# Patient Record
Sex: Female | Born: 1970 | Race: Black or African American | Hispanic: No | Marital: Married | Smoking: Former smoker
Health system: Southern US, Community
[De-identification: ages and names within clinical notes are randomized; demographics above are authoritative.]

## PROBLEM LIST (undated history)

## (undated) DIAGNOSIS — J309 Allergic rhinitis, unspecified: Secondary | ICD-10-CM

## (undated) DIAGNOSIS — T7840XA Allergy, unspecified, initial encounter: Secondary | ICD-10-CM

## (undated) DIAGNOSIS — E119 Type 2 diabetes mellitus without complications: Secondary | ICD-10-CM

## (undated) DIAGNOSIS — G629 Polyneuropathy, unspecified: Secondary | ICD-10-CM

## (undated) DIAGNOSIS — G709 Myoneural disorder, unspecified: Secondary | ICD-10-CM

## (undated) HISTORY — DX: Allergic rhinitis, unspecified: J30.9

## (undated) HISTORY — DX: Type 2 diabetes mellitus without complications: E11.9

## (undated) HISTORY — DX: Allergy, unspecified, initial encounter: T78.40XA

## (undated) HISTORY — PX: OTHER SURGICAL HISTORY: SHX169

## (undated) HISTORY — PX: TONSILLECTOMY AND ADENOIDECTOMY: SHX28

---

## 1993-11-17 HISTORY — PX: OTHER SURGICAL HISTORY: SHX169

## 1998-06-11 ENCOUNTER — Other Ambulatory Visit: Admission: RE | Admit: 1998-06-11 | Discharge: 1998-06-11 | Payer: Self-pay | Admitting: Family Medicine

## 1999-08-12 ENCOUNTER — Emergency Department (HOSPITAL_COMMUNITY): Admission: EM | Admit: 1999-08-12 | Discharge: 1999-08-12 | Payer: Self-pay | Admitting: Emergency Medicine

## 2000-02-20 ENCOUNTER — Other Ambulatory Visit: Admission: RE | Admit: 2000-02-20 | Discharge: 2000-02-20 | Payer: Self-pay | Admitting: Family Medicine

## 2000-06-13 ENCOUNTER — Emergency Department (HOSPITAL_COMMUNITY): Admission: EM | Admit: 2000-06-13 | Discharge: 2000-06-13 | Payer: Self-pay | Admitting: Emergency Medicine

## 2001-02-23 ENCOUNTER — Emergency Department (HOSPITAL_COMMUNITY): Admission: EM | Admit: 2001-02-23 | Discharge: 2001-02-23 | Payer: Self-pay | Admitting: Emergency Medicine

## 2001-12-10 ENCOUNTER — Ambulatory Visit (HOSPITAL_COMMUNITY): Admission: RE | Admit: 2001-12-10 | Discharge: 2001-12-10 | Payer: Self-pay | Admitting: Family Medicine

## 2001-12-17 ENCOUNTER — Encounter: Payer: Self-pay | Admitting: Family Medicine

## 2001-12-17 ENCOUNTER — Emergency Department (HOSPITAL_COMMUNITY): Admission: EM | Admit: 2001-12-17 | Discharge: 2001-12-17 | Payer: Self-pay | Admitting: *Deleted

## 2002-05-03 ENCOUNTER — Other Ambulatory Visit: Admission: RE | Admit: 2002-05-03 | Discharge: 2002-05-03 | Payer: Self-pay | Admitting: Family Medicine

## 2003-02-23 ENCOUNTER — Emergency Department (HOSPITAL_COMMUNITY): Admission: EM | Admit: 2003-02-23 | Discharge: 2003-02-23 | Payer: Self-pay | Admitting: Emergency Medicine

## 2003-02-23 ENCOUNTER — Encounter: Payer: Self-pay | Admitting: Emergency Medicine

## 2003-10-24 ENCOUNTER — Emergency Department (HOSPITAL_COMMUNITY): Admission: EM | Admit: 2003-10-24 | Discharge: 2003-10-24 | Payer: Self-pay | Admitting: Emergency Medicine

## 2003-11-13 ENCOUNTER — Emergency Department (HOSPITAL_COMMUNITY): Admission: EM | Admit: 2003-11-13 | Discharge: 2003-11-13 | Payer: Self-pay | Admitting: Emergency Medicine

## 2005-02-19 ENCOUNTER — Ambulatory Visit: Payer: Self-pay | Admitting: Internal Medicine

## 2005-03-17 ENCOUNTER — Ambulatory Visit: Payer: Self-pay | Admitting: Family Medicine

## 2005-03-19 ENCOUNTER — Ambulatory Visit: Payer: Self-pay | Admitting: Family Medicine

## 2005-04-08 ENCOUNTER — Other Ambulatory Visit: Admission: RE | Admit: 2005-04-08 | Discharge: 2005-04-08 | Payer: Self-pay | Admitting: Family Medicine

## 2005-04-08 ENCOUNTER — Ambulatory Visit: Payer: Self-pay | Admitting: Internal Medicine

## 2005-10-14 ENCOUNTER — Ambulatory Visit: Payer: Self-pay | Admitting: Family Medicine

## 2005-12-08 ENCOUNTER — Ambulatory Visit: Payer: Self-pay | Admitting: Family Medicine

## 2006-05-29 ENCOUNTER — Ambulatory Visit: Payer: Self-pay | Admitting: Family Medicine

## 2009-01-15 ENCOUNTER — Ambulatory Visit: Payer: Self-pay | Admitting: Internal Medicine

## 2010-08-06 ENCOUNTER — Emergency Department (HOSPITAL_COMMUNITY): Admission: EM | Admit: 2010-08-06 | Discharge: 2010-08-06 | Payer: Self-pay | Admitting: Emergency Medicine

## 2011-01-30 LAB — POCT URINALYSIS DIPSTICK
Bilirubin Urine: NEGATIVE
Glucose, UA: NEGATIVE mg/dL
Nitrite: NEGATIVE
Protein, ur: NEGATIVE mg/dL
Specific Gravity, Urine: 1.025 (ref 1.005–1.030)
Urobilinogen, UA: 0.2 mg/dL (ref 0.0–1.0)
pH: 5.5 (ref 5.0–8.0)

## 2011-01-30 LAB — POCT I-STAT, CHEM 8
Creatinine, Ser: 0.7 mg/dL (ref 0.4–1.2)
Hemoglobin: 15 g/dL (ref 12.0–15.0)
Potassium: 3.6 mEq/L (ref 3.5–5.1)
Sodium: 139 mEq/L (ref 135–145)

## 2011-01-30 LAB — POCT PREGNANCY, URINE: Preg Test, Ur: NEGATIVE

## 2012-06-16 ENCOUNTER — Encounter (HOSPITAL_COMMUNITY): Payer: Self-pay | Admitting: *Deleted

## 2012-06-16 ENCOUNTER — Emergency Department (HOSPITAL_COMMUNITY)
Admission: EM | Admit: 2012-06-16 | Discharge: 2012-06-16 | Disposition: A | Discharge status: Home / Self Care | Payer: Self-pay | Source: Home / Self Care

## 2012-06-16 DIAGNOSIS — B9789 Other viral agents as the cause of diseases classified elsewhere: Secondary | ICD-10-CM

## 2012-06-16 DIAGNOSIS — N390 Urinary tract infection, site not specified: Secondary | ICD-10-CM

## 2012-06-16 DIAGNOSIS — R197 Diarrhea, unspecified: Secondary | ICD-10-CM

## 2012-06-16 DIAGNOSIS — B349 Viral infection, unspecified: Secondary | ICD-10-CM

## 2012-06-16 LAB — POCT URINALYSIS DIP (DEVICE)
Bilirubin Urine: NEGATIVE
Glucose, UA: NEGATIVE mg/dL
Ketones, ur: NEGATIVE mg/dL
Nitrite: NEGATIVE
pH: 6 (ref 5.0–8.0)

## 2012-06-16 MED ORDER — IBUPROFEN 800 MG PO TABS: 800.0000 mg | ORAL_TABLET | Freq: Three times a day (TID) | ORAL | Status: AC

## 2012-06-16 MED ORDER — ONDANSETRON 4 MG PO TBDP
ORAL_TABLET | ORAL | Status: AC
  Filled 2012-06-16: qty 1

## 2012-06-16 MED ORDER — ONDANSETRON 4 MG PO TBDP
8.0000 mg | ORAL_TABLET | Freq: Once | ORAL | Status: AC
  Administered 2012-06-16: 8 mg via ORAL

## 2012-06-16 MED ORDER — SULFAMETHOXAZOLE-TRIMETHOPRIM 800-160 MG PO TABS: 1.0000 | ORAL_TABLET | Freq: Two times a day (BID) | ORAL | Status: AC

## 2012-06-16 MED ORDER — ONDANSETRON HCL 4 MG PO TABS: 4.0000 mg | ORAL_TABLET | Freq: Four times a day (QID) | ORAL | Status: AC

## 2012-06-16 NOTE — ED Provider Notes (Signed)
History     CSN: [id number]  Arrival date & time 06/16/12  1606   None     Chief Complaint  Patient presents with  . Headache    (Consider location/radiation/quality/duration/timing/severity/associated sxs/prior treatment) The history is provided by the patient.  Madison Herrera is a 41 y.o. female who complains of bilateral temporal headache associated with dizziness, scalp tenderness and glands swollen noted today.  Reports onset of diarrhea that began 3 days ago.  Stool sizes vary from 2 tablespoons to 3 cupfuls with each episode x 7 episodes.  Stools brown in color, +fecal material associated with no fever.  + abdominal cramps, +bloating, +flatulence, -tenesmus, +anorexia, +nausea, -vomiting, +myalgias; denies bloody stools, mucus, foul odor.  Denies change in eating habits, no knowledge of spoiled food intake, no history of travel.  No ill contacts in home, no history of inflammatory bowel disease; denies family hx of celiac disease.  No history of headaches, this is not worse headache she has ever had, +photophobia, no extremity weakness, denies paresthesias.     Past Medical History  Diagnosis Date  . Diabetes mellitus     Past Surgical History  Procedure Date  . Cesarean section   . Btl     No family history on file.  History  Substance Use Topics  . Smoking status: Never Smoker   . Smokeless tobacco: Not on file  . Alcohol Use: Yes    OB History    Grav Para Term Preterm Abortions TAB SAB Ect Mult Living                  Review of Systems  Constitutional: Positive for fever, chills and fatigue.  HENT: Negative for sore throat and sinus pressure.   Eyes: Positive for photophobia.  Cardiovascular: Negative.   Gastrointestinal: Positive for nausea, abdominal pain and diarrhea. Negative for vomiting and blood in stool.  Genitourinary: Positive for flank pain.  Musculoskeletal: Positive for myalgias.  Skin: Negative.   Neurological: Positive for dizziness,  light-headedness and headaches.  Hematological: Positive for adenopathy.  All other systems reviewed and are negative.    Allergies  Penicillins  Home Medications  No current outpatient prescriptions on file.  BP 126/64  Pulse 74  Temp 98.5 F (36.9 C) (Oral)  Resp 16  SpO2 98%  LMP 05/22/2012  Physical Exam  Nursing note and vitals reviewed. Constitutional: She is oriented to person, place, and time. Vital signs are normal. She appears well-developed and well-nourished. She is active and cooperative. She has a sickly appearance.  HENT:  Head: Normocephalic.  Right Ear: Hearing, tympanic membrane, external ear and ear canal normal.  Left Ear: Hearing, tympanic membrane, external ear and ear canal normal.  Nose: Nose normal. Right sinus exhibits no maxillary sinus tenderness and no frontal sinus tenderness. Left sinus exhibits no maxillary sinus tenderness and no frontal sinus tenderness.       Scalp tenderness, head   Eyes: Conjunctivae and EOM are normal. Pupils are equal, round, and reactive to light. No scleral icterus.  Neck: Trachea normal. Neck supple.  Cardiovascular: Normal rate, regular rhythm, normal heart sounds and normal pulses.   Pulmonary/Chest: Effort normal and breath sounds normal.  Abdominal: Soft. Normal appearance and bowel sounds are normal. There is tenderness in the suprapubic area. There is no rebound and no CVA tenderness.  Lymphadenopathy:       Head (left side): Occipital adenopathy present.    She has cervical adenopathy.       Left  cervical: Superficial cervical adenopathy present.       Left: Inguinal adenopathy present.       Shotty and tender lymph nodes throughout  Neurological: She is alert and oriented to person, place, and time. She has normal strength. No cranial nerve deficit or sensory deficit. GCS eye subscore is 4. GCS verbal subscore is 5. GCS motor subscore is 6.  Skin: Skin is warm, dry and intact. No rash noted.  Psychiatric: She  has a normal mood and affect. Her speech is normal and behavior is normal. Judgment and thought content normal. Cognition and memory are normal.    ED Course  Procedures (including critical care time)  Labs Reviewed  POCT URINALYSIS DIP (DEVICE) - Abnormal; Notable for the following:    Hgb urine dipstick SMALL (*)     Urobilinogen, UA 2.0 (*)     Leukocytes, UA SMALL (*)  Biochemical Testing Only. Please order routine urinalysis from main lab if confirmatory testing is needed.   All other components within normal limits   No results found.   1. Diarrhea   2. Viral illness   3. UTI (lower urinary tract infection)       MDM  Increase fluids, rest.  Medications as prescribed, I think headache is related lack of dietary intake(fluids) and diarrhea.  Patient instructed to RTC or follow up with primary care provider if symptoms are not improved        Johnsie Kindred, NP 06/16/12 1816

## 2012-06-16 NOTE — ED Notes (Signed)
Pt  Has  Symptoms  Of  Headache   Nausea   Body  Aches           As  Well as   Some  Diarrhea        X  3  Days  -   She  denys  Any  Vomiting    -  No other  Family  Members  Ill     She  Ambulated  To  Exam room  With a  Fluid  Steady  Gait

## 2012-06-22 NOTE — ED Provider Notes (Signed)
Medical screening examination/treatment/procedure(s) were performed by non-physician practitioner and as supervising physician I was immediately available for consultation/collaboration.  Luiz Blare MD   Luiz Blare, MD 06/22/12 2239

## 2013-05-30 ENCOUNTER — Other Ambulatory Visit: Payer: Self-pay | Admitting: Internal Medicine

## 2013-05-30 DIAGNOSIS — N920 Excessive and frequent menstruation with regular cycle: Secondary | ICD-10-CM

## 2013-06-03 ENCOUNTER — Other Ambulatory Visit: Payer: BC Managed Care – PPO

## 2013-06-10 ENCOUNTER — Ambulatory Visit
Admission: RE | Admit: 2013-06-10 | Discharge: 2013-06-10 | Disposition: A | Discharge status: Home / Self Care | Payer: Medicaid Other | Source: Ambulatory Visit | Attending: Internal Medicine | Admitting: Internal Medicine

## 2013-06-10 DIAGNOSIS — N920 Excessive and frequent menstruation with regular cycle: Secondary | ICD-10-CM

## 2013-06-21 ENCOUNTER — Encounter: Payer: Self-pay | Admitting: Obstetrics

## 2013-06-21 ENCOUNTER — Other Ambulatory Visit: Payer: Self-pay | Admitting: Internal Medicine

## 2013-06-21 DIAGNOSIS — Z1231 Encounter for screening mammogram for malignant neoplasm of breast: Secondary | ICD-10-CM

## 2013-07-01 ENCOUNTER — Encounter: Payer: Self-pay | Admitting: Obstetrics

## 2013-07-01 ENCOUNTER — Ambulatory Visit (INDEPENDENT_AMBULATORY_CARE_PROVIDER_SITE_OTHER): Payer: Medicaid Other | Admitting: Obstetrics

## 2013-07-01 VITALS — BP 117/79 | HR 67 | Temp 99.8°F | Ht 62.0 in | Wt 172.0 lb

## 2013-07-01 DIAGNOSIS — D259 Leiomyoma of uterus, unspecified: Secondary | ICD-10-CM

## 2013-07-01 DIAGNOSIS — N920 Excessive and frequent menstruation with regular cycle: Secondary | ICD-10-CM

## 2013-07-01 DIAGNOSIS — Z1239 Encounter for other screening for malignant neoplasm of breast: Secondary | ICD-10-CM

## 2013-07-01 DIAGNOSIS — N946 Dysmenorrhea, unspecified: Secondary | ICD-10-CM

## 2013-07-01 HISTORY — DX: Excessive and frequent menstruation with regular cycle: N92.0

## 2013-07-01 HISTORY — DX: Dysmenorrhea, unspecified: N94.6

## 2013-07-01 HISTORY — DX: Leiomyoma of uterus, unspecified: D25.9

## 2013-07-01 MED ORDER — NAPROXEN 500 MG PO TBEC: 500.0000 mg | DELAYED_RELEASE_TABLET | Freq: Two times a day (BID) | ORAL | Status: DC

## 2013-07-01 MED ORDER — OXYCODONE-ACETAMINOPHEN 10-325 MG PO TABS: 1.0000 | ORAL_TABLET | Freq: Four times a day (QID) | ORAL | Status: DC | PRN

## 2013-07-01 MED ORDER — MEDROXYPROGESTERONE ACETATE 10 MG PO TABS: ORAL_TABLET | ORAL | Status: DC

## 2013-07-01 MED ORDER — OXYCODONE-ACETAMINOPHEN 10-325 MG PO TABS: 1.0000 | ORAL_TABLET | ORAL | Status: DC | PRN

## 2013-07-01 NOTE — Progress Notes (Signed)
Subjective:    Madison Herrera is a 42 y.o. female who presents with uterine fibroids. Periods are regular every 28-30 days, lasting 3- days days. Dysmenorrhea:severe, occurring premenstrually and throughout cycle. Cyclic symptoms include bloating, diarrhea, insomnia, irritability and pelvic pain. No intermenstrual bleeding, spotting, or discharge.  Current contraception: none History of abnormal Pap smear: no Family history of uterine or ovarian cancer: yes - Paternal Aunt Regular self breast exam: yes History of abnormal mammogram: never had one done Family history of breast cancer: no History of abnormal lipids: no Menstrual History: OB History   Grav Para Term Preterm Abortions TAB SAB Ect Mult Living   3 2 2  1     2       Menarche age: not asked  Patient's last menstrual period was 06/30/2013.    The following portions of the patient's history were reviewed and updated as appropriate: allergies, current medications, past family history, past medical history, past social history, past surgical history and problem list.  Review of Systems Pertinent items are noted in HPI.    Objective:     BP 117/79  Pulse 67  Temp(Src) 99.8 F (37.7 C) (Oral)  Ht 5\' 2"  (1.575 m)  Wt 172 lb (78.019 kg)  BMI 31.45 kg/m2  LMP 06/30/2013 No exam performed today, Consult only..    Assessment:    Symptomatic uterine fibroids.    Plan:    All questions answered. Agricultural engineer distributed. Considering Endometrial Ablation.  Provera Rx for 30 days prior to ablation Mammogram ordered F/U in 2 weeks for Annual and Pap.

## 2013-07-05 ENCOUNTER — Ambulatory Visit
Admission: RE | Admit: 2013-07-05 | Discharge: 2013-07-05 | Disposition: A | Discharge status: Home / Self Care | Payer: Medicaid Other | Source: Ambulatory Visit | Attending: Internal Medicine | Admitting: Internal Medicine

## 2013-07-05 DIAGNOSIS — Z1231 Encounter for screening mammogram for malignant neoplasm of breast: Secondary | ICD-10-CM

## 2013-07-11 ENCOUNTER — Other Ambulatory Visit: Payer: Self-pay

## 2013-07-11 ENCOUNTER — Other Ambulatory Visit: Payer: Self-pay | Admitting: Internal Medicine

## 2013-07-11 DIAGNOSIS — R928 Other abnormal and inconclusive findings on diagnostic imaging of breast: Secondary | ICD-10-CM

## 2013-07-13 ENCOUNTER — Encounter: Payer: Self-pay | Admitting: Obstetrics

## 2013-07-13 ENCOUNTER — Ambulatory Visit (INDEPENDENT_AMBULATORY_CARE_PROVIDER_SITE_OTHER): Payer: Medicaid Other | Admitting: Obstetrics

## 2013-07-13 VITALS — BP 102/68 | HR 81 | Temp 97.8°F | Ht 62.0 in | Wt 174.0 lb

## 2013-07-13 DIAGNOSIS — D259 Leiomyoma of uterus, unspecified: Secondary | ICD-10-CM

## 2013-07-13 DIAGNOSIS — N920 Excessive and frequent menstruation with regular cycle: Secondary | ICD-10-CM

## 2013-07-13 DIAGNOSIS — Z0000 Encounter for general adult medical examination without abnormal findings: Secondary | ICD-10-CM

## 2013-07-13 MED ORDER — MEDROXYPROGESTERONE ACETATE 10 MG PO TABS: ORAL_TABLET | ORAL | Status: DC

## 2013-07-13 NOTE — Progress Notes (Signed)
Subjective:     Madison Herrera is a 42 y.o. female here for a routine exam.  Current complaints: Patient is in office today to complete her exam ( bleeding at last visit). Patient wants to discuss her surgery plan.  Personal health questionnaire reviewed: no.   Gynecologic History Patient's last menstrual period was 06/30/2013. Contraception: abstinence Last Pap: 1 year +. Results were: normal Last mammogram: last week. Results were: abnormal- additional view of L breast  Obstetric History OB History  Gravida Para Term Preterm AB SAB TAB Ectopic Multiple Living  3 2 2  1     2     # Outcome Date GA Lbr Len/2nd Weight Sex Delivery Anes PTL Lv  3 TRM 1995 [redacted]w[redacted]d  10 lb 4 oz (4.649 kg) M CS   Y  2 ABT 1993          1 TRM 1992 [redacted]w[redacted]d  7 lb 12 oz (3.515 kg) F CS   Y       The following portions of the patient's history were reviewed and updated as appropriate: allergies, current medications, past family history, past medical history, past social history, past surgical history and problem list.  Review of Systems Pertinent items are noted in HPI.    Objective:    General appearance: alert and no distress Abdomen: normal findings: soft, non-tender Pelvic: cervix normal in appearance, external genitalia normal, no adnexal masses or tenderness, no cervical motion tenderness, uterus normal size, shape, and consistency and vagina normal without discharge    Assessment:    Healthy female exam.   H/O Fibroids and Menorrhagia.  Uterine cavity appeas normal on ultrasound.  Wants Endometrial Ablation.   Plan:    Education reviewed: Management of AUB.    Pap done.  Will schedule Ablation in 2 weeks.  Has been on Provera for past 2 weeks.

## 2013-07-14 LAB — PAP IG W/ RFLX HPV ASCU

## 2013-07-14 LAB — WET PREP BY MOLECULAR PROBE: Candida species: NEGATIVE

## 2013-07-15 ENCOUNTER — Other Ambulatory Visit: Payer: Self-pay | Admitting: *Deleted

## 2013-07-15 DIAGNOSIS — B9689 Other specified bacterial agents as the cause of diseases classified elsewhere: Secondary | ICD-10-CM

## 2013-07-15 MED ORDER — METRONIDAZOLE 500 MG PO TABS: 500.0000 mg | ORAL_TABLET | Freq: Two times a day (BID) | ORAL | Status: DC

## 2013-07-26 ENCOUNTER — Ambulatory Visit
Admission: RE | Admit: 2013-07-26 | Discharge: 2013-07-26 | Disposition: A | Discharge status: Home / Self Care | Payer: Medicaid Other | Source: Ambulatory Visit | Attending: Internal Medicine | Admitting: Internal Medicine

## 2013-07-26 DIAGNOSIS — R928 Other abnormal and inconclusive findings on diagnostic imaging of breast: Secondary | ICD-10-CM

## 2013-08-02 ENCOUNTER — Other Ambulatory Visit: Payer: Medicaid Other

## 2013-08-15 ENCOUNTER — Encounter: Payer: Self-pay | Admitting: Obstetrics

## 2013-08-16 ENCOUNTER — Encounter (HOSPITAL_COMMUNITY): Payer: Self-pay

## 2013-08-16 ENCOUNTER — Other Ambulatory Visit: Payer: Self-pay | Admitting: *Deleted

## 2013-08-16 ENCOUNTER — Encounter: Payer: Self-pay | Admitting: Obstetrics

## 2013-08-17 ENCOUNTER — Encounter (HOSPITAL_COMMUNITY): Payer: Self-pay | Admitting: Pharmacist

## 2013-08-19 ENCOUNTER — Ambulatory Visit (HOSPITAL_COMMUNITY): Payer: Medicaid Other | Admitting: Anesthesiology

## 2013-08-19 ENCOUNTER — Encounter (HOSPITAL_COMMUNITY): Payer: Self-pay | Admitting: Anesthesiology

## 2013-08-19 ENCOUNTER — Encounter (HOSPITAL_COMMUNITY)
Admission: RE | Disposition: A | Discharge status: Home / Self Care | Payer: Self-pay | Source: Ambulatory Visit | Attending: Obstetrics

## 2013-08-19 ENCOUNTER — Ambulatory Visit (HOSPITAL_COMMUNITY)
Admission: RE | Admit: 2013-08-19 | Discharge: 2013-08-19 | Disposition: A | Discharge status: Home / Self Care | Payer: Medicaid Other | Source: Ambulatory Visit | Attending: Obstetrics | Admitting: Obstetrics

## 2013-08-19 ENCOUNTER — Encounter (HOSPITAL_COMMUNITY): Payer: Self-pay | Admitting: *Deleted

## 2013-08-19 DIAGNOSIS — N938 Other specified abnormal uterine and vaginal bleeding: Secondary | ICD-10-CM | POA: Insufficient documentation

## 2013-08-19 DIAGNOSIS — N920 Excessive and frequent menstruation with regular cycle: Secondary | ICD-10-CM

## 2013-08-19 DIAGNOSIS — N840 Polyp of corpus uteri: Secondary | ICD-10-CM | POA: Insufficient documentation

## 2013-08-19 DIAGNOSIS — N949 Unspecified condition associated with female genital organs and menstrual cycle: Secondary | ICD-10-CM | POA: Insufficient documentation

## 2013-08-19 HISTORY — DX: Polyneuropathy, unspecified: G62.9

## 2013-08-19 HISTORY — DX: Myoneural disorder, unspecified: G70.9

## 2013-08-19 HISTORY — PX: DILITATION & CURRETTAGE/HYSTROSCOPY WITH HYDROTHERMAL ABLATION: SHX5570

## 2013-08-19 LAB — CBC
Hemoglobin: 13.1 g/dL (ref 12.0–15.0)
Platelets: 271 10*3/uL (ref 150–400)
RBC: 4.04 MIL/uL (ref 3.87–5.11)
RDW: 12.6 % (ref 11.5–15.5)
WBC: 9.3 10*3/uL (ref 4.0–10.5)

## 2013-08-19 LAB — PREGNANCY, URINE: Preg Test, Ur: NEGATIVE

## 2013-08-19 SURGERY — DILATATION & CURETTAGE/HYSTEROSCOPY WITH HYDROTHERMAL ABLATION
Anesthesia: General | Site: Uterus | Wound class: Clean Contaminated

## 2013-08-19 MED ORDER — FENTANYL CITRATE 0.05 MG/ML IJ SOLN
INTRAMUSCULAR | Status: AC
  Filled 2013-08-19: qty 2

## 2013-08-19 MED ORDER — FENTANYL CITRATE 0.05 MG/ML IJ SOLN
25.0000 ug | INTRAMUSCULAR | Status: DC | PRN
  Administered 2013-08-19: 25 ug via INTRAVENOUS

## 2013-08-19 MED ORDER — ONDANSETRON HCL 4 MG/2ML IJ SOLN
INTRAMUSCULAR | Status: DC | PRN
  Administered 2013-08-19: 4 mg via INTRAVENOUS

## 2013-08-19 MED ORDER — LACTATED RINGERS IV SOLN
INTRAVENOUS | Status: DC
  Administered 2013-08-19 (×2): via INTRAVENOUS

## 2013-08-19 MED ORDER — SODIUM CHLORIDE 0.9 % IR SOLN
Status: DC | PRN
  Administered 2013-08-19: 3000 mL

## 2013-08-19 MED ORDER — PROPOFOL 10 MG/ML IV EMUL
INTRAVENOUS | Status: AC
  Filled 2013-08-19: qty 20

## 2013-08-19 MED ORDER — OXYCODONE-ACETAMINOPHEN 10-325 MG PO TABS: 1.0000 | ORAL_TABLET | Freq: Four times a day (QID) | ORAL | Status: DC | PRN

## 2013-08-19 MED ORDER — MIDAZOLAM HCL 2 MG/2ML IJ SOLN
INTRAMUSCULAR | Status: AC
  Filled 2013-08-19: qty 2

## 2013-08-19 MED ORDER — LIDOCAINE HCL (CARDIAC) 20 MG/ML IV SOLN
INTRAVENOUS | Status: DC | PRN
  Administered 2013-08-19: 50 mg via INTRAVENOUS

## 2013-08-19 MED ORDER — PROPOFOL 10 MG/ML IV BOLUS
INTRAVENOUS | Status: DC | PRN
  Administered 2013-08-19: 200 mg via INTRAVENOUS

## 2013-08-19 MED ORDER — LIDOCAINE HCL (CARDIAC) 20 MG/ML IV SOLN
INTRAVENOUS | Status: AC
  Filled 2013-08-19: qty 5

## 2013-08-19 MED ORDER — FENTANYL CITRATE 0.05 MG/ML IJ SOLN
INTRAMUSCULAR | Status: DC | PRN
  Administered 2013-08-19: 100 ug via INTRAVENOUS

## 2013-08-19 MED ORDER — KETOROLAC TROMETHAMINE 30 MG/ML IJ SOLN: 15.0000 mg | Freq: Once | INTRAMUSCULAR | Status: DC | PRN

## 2013-08-19 MED ORDER — SILVER NITRATE-POT NITRATE 75-25 % EX MISC
CUTANEOUS | Status: AC
  Filled 2013-08-19: qty 3

## 2013-08-19 MED ORDER — MIDAZOLAM HCL 2 MG/2ML IJ SOLN
INTRAMUSCULAR | Status: DC | PRN
  Administered 2013-08-19: 2 mg via INTRAVENOUS

## 2013-08-19 MED ORDER — KETOROLAC TROMETHAMINE 30 MG/ML IJ SOLN
INTRAMUSCULAR | Status: AC
  Filled 2013-08-19: qty 1

## 2013-08-19 MED ORDER — LACTATED RINGERS IV SOLN
INTRAVENOUS | Status: DC
  Administered 2013-08-19: 15:00:00 via INTRAVENOUS

## 2013-08-19 MED ORDER — ONDANSETRON HCL 4 MG/2ML IJ SOLN
INTRAMUSCULAR | Status: AC
  Filled 2013-08-19: qty 2

## 2013-08-19 MED ORDER — GLYCOPYRROLATE 0.2 MG/ML IJ SOLN
INTRAMUSCULAR | Status: DC | PRN
  Administered 2013-08-19: 0.2 mg via INTRAVENOUS

## 2013-08-19 MED ORDER — ONDANSETRON HCL 4 MG/2ML IJ SOLN: 4.0000 mg | Freq: Once | INTRAMUSCULAR | Status: DC | PRN

## 2013-08-19 MED ORDER — LIDOCAINE HCL 1 % IJ SOLN
INTRAMUSCULAR | Status: DC | PRN
  Administered 2013-08-19: 20 mL

## 2013-08-19 MED ORDER — FENTANYL CITRATE 0.05 MG/ML IJ SOLN
INTRAMUSCULAR | Status: AC
  Administered 2013-08-19: 25 ug via INTRAVENOUS
  Filled 2013-08-19: qty 2

## 2013-08-19 MED ORDER — KETOROLAC TROMETHAMINE 30 MG/ML IJ SOLN
INTRAMUSCULAR | Status: DC | PRN
  Administered 2013-08-19: 30 mg via INTRAVENOUS

## 2013-08-19 MED ORDER — MEPERIDINE HCL 25 MG/ML IJ SOLN: 6.2500 mg | INTRAMUSCULAR | Status: DC | PRN

## 2013-08-19 MED ORDER — SILVER NITRATE-POT NITRATE 75-25 % EX MISC
CUTANEOUS | Status: DC | PRN
  Administered 2013-08-19: 2

## 2013-08-19 SURGICAL SUPPLY — 19 items
CATH ROBINSON RED A/P 16FR (CATHETERS) IMPLANT
CLOTH BEACON ORANGE TIMEOUT ST (SAFETY) ×2 IMPLANT
CONTAINER PREFILL 10% NBF 60ML (FORM) ×4 IMPLANT
DRAPE HYSTEROSCOPY (DRAPE) IMPLANT
DRESSING TELFA 8X3 (GAUZE/BANDAGES/DRESSINGS) ×2 IMPLANT
ELECT REM PT RETURN 9FT ADLT (ELECTROSURGICAL)
ELECTRODE REM PT RTRN 9FT ADLT (ELECTROSURGICAL) IMPLANT
GLOVE BIO SURGEON STRL SZ8 (GLOVE) ×4 IMPLANT
GOWN PREVENTION PLUS XLARGE (GOWN DISPOSABLE) ×2 IMPLANT
GOWN STRL REIN XL XLG (GOWN DISPOSABLE) ×4 IMPLANT
NEEDLE SPNL 22GX3.5 QUINCKE BK (NEEDLE) ×2 IMPLANT
NS IRRIG 1000ML POUR BTL (IV SOLUTION) ×2 IMPLANT
PACK HYSTEROSCOPY LF (CUSTOM PROCEDURE TRAY) ×2 IMPLANT
PACK VAGINAL MINOR WOMEN LF (CUSTOM PROCEDURE TRAY) IMPLANT
PAD OB MATERNITY 4.3X12.25 (PERSONAL CARE ITEMS) ×2 IMPLANT
SET GENESYS HTA PROCERVA (MISCELLANEOUS) ×2 IMPLANT
SYR CONTROL 10ML LL (SYRINGE) ×2 IMPLANT
TOWEL OR 17X24 6PK STRL BLUE (TOWEL DISPOSABLE) ×4 IMPLANT
WATER STERILE IRR 1000ML POUR (IV SOLUTION) ×2 IMPLANT

## 2013-08-19 NOTE — Op Note (Signed)
Preop Diagnosis: MENORRHAGIA   Postop Diagnosis: menorrhagia   Procedure: DILATATION & CURETTAGE/HYSTEROSCOPY WITH HYDROTHERMAL ABLATION   Anesthesia: General   Anesthesiologist: Responsible provider cannot be found from this context.   Attending: Brock Bad, MD   Assistant: Surgical Technician  Findings:  Small endometrial polyp  Pathology: Endometrial curretings  Fluids:   UOP:  5ml  EBL:  10ml  Complications:  None  Procedure: The patient was taken to the operating room after risks benefits and alternatives were discussed with patient, the patient verbalized understanding and consent signed and witnessed. The patient was placed under general anesthesia and prepped and draped in normal sterile fashion. A bivalve speculum was placed in the patient's vagina and the anterior lip of the cervix was grasped with a single-tooth tenaculum. The cervix was dilated for passage of the hysteroscope. The uterus sounded to 9cm.  The hysteroscope was introduced into the uterine cavity with findings as noted above. A curettage was performed and currettings sent to pathology. The hysteroscope was reintroduced and hydrothermal ablation was performed without difficulty. The tenaculum and bivalve speculum were removed and there was good hemostasis at the tenaculum sites. Sponge lap and needle count was correct. The patient tolerated procedure well and was returned to the recovery room in good condition.

## 2013-08-19 NOTE — Anesthesia Postprocedure Evaluation (Signed)
Anesthesia Post Note  Patient: Madison Herrera  Procedure(s) Performed: Procedure(s) (LRB): DILATATION & CURETTAGE/HYSTEROSCOPY WITH HYDROTHERMAL ABLATION (N/A)  Anesthesia type: General  Patient location: PACU  Post pain: Pain level controlled  Post assessment: Post-op Vital signs reviewed  Last Vitals:  Filed Vitals:   08/19/13 1700  BP: 132/69  Pulse: 68  Temp: 36.4 C  Resp: 18    Post vital signs: Reviewed  Level of consciousness: sedated  Complications: No apparent anesthesia complications

## 2013-08-19 NOTE — H&P (Signed)
Madison Herrera is an 42 y.o. female. H/O heavy periods, unresponsive to medical therapy.  Pertinent Gynecological History: Menses: flow is excessive with use of several pads or tampons on heaviest days Bleeding: dysfunctional uterine bleeding Contraception: tubal ligation DES exposure: denies Blood transfusions: none Sexually transmitted diseases: no past history Previous GYN Procedures: none  Last mammogram: normal Date: 2014 Last pap: normal Date: 2014 OB History: G3, P2   Menstrual History: Menarche age: 30  Patient's last menstrual period was 07/14/2013.    Past Medical History  Diagnosis Date  . Allergic rhinitis   . Neuropathy   . 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 Cesarean section      x 2  . Btl    . Tonsillectomy and adenoidectomy      age 62    Family History  Problem Relation Age of Onset  . Cancer - Lung Mother   . Cancer - Colon Brother   . Stomach cancer Brother   . Uterine cancer Paternal Aunt   . Diabetes Mother   . Diabetes Father   . Heart disease Neg Hx   . Hypertension Father   . Healthy Brother   . Healthy Sister     x2    Social History:  reports that she has been smoking Cigarettes.  She has a .125 pack-year smoking history. She has never used smokeless tobacco. She reports that  drinks alcohol. She reports that she does not use illicit drugs.  Allergies:  Allergies  Allergen Reactions  . Latex Rash    fever  . Penicillins Rash    Prescriptions prior to admission  Medication Sig Dispense Refill  . cyclobenzaprine (FLEXERIL) 5 MG tablet Take 5 mg by mouth 2 (two) times daily as needed for muscle spasms.      . fluticasone (FLONASE) 50 MCG/ACT nasal spray Place 2 sprays into the nose daily.      Marland Kitchen gabapentin (NEURONTIN) 300 MG capsule Take 300 mg by mouth 3 (three) times daily.      Marland Kitchen loratadine (CLARITIN) 10 MG tablet Take 10 mg by mouth daily.       . naproxen (EC-NAPROSYN) 500 MG EC tablet Take 1 tablet (500 mg total) by mouth 2 (two) times daily with a meal.  60 tablet  11  . oxyCODONE-acetaminophen (PERCOCET) 10-325 MG per tablet Take 1 tablet by mouth every 6 (six) hours as needed for pain.  40 tablet  0  . medroxyPROGESTERone (PROVERA) 10 MG tablet Take 2 tablets po daily.  40 tablet  0    Review of Systems  All other systems reviewed and are negative.    Blood pressure 132/78, pulse 71, temperature 98.4 F (36.9 C), temperature source Oral, resp. rate 18, height 5\' 2"  (1.575 m), weight 160 lb (72.576 kg), last menstrual period 07/14/2013, SpO2 100.00%. Physical Exam  Nursing note and vitals reviewed. Constitutional: She is oriented to person, place, and time. She appears well-developed and well-nourished.  HENT:  Head: Normocephalic and atraumatic.  Eyes: Conjunctivae are normal. Pupils are equal, round, and reactive to light.  Neck: Normal range of motion. Neck supple.  Cardiovascular: Normal rate and regular rhythm.   Respiratory: Effort normal and breath sounds normal.  GI: Soft.  Genitourinary: Vagina normal.  Musculoskeletal: Normal range of motion.  Neurological: She is alert and oriented to person, place, and time.  Skin: Skin is  warm and dry.  Psychiatric: She has a normal mood and affect. Her behavior is normal. Judgment and thought content normal.    Results for orders placed during the hospital encounter of 08/19/13 (from the past 24 hour(s))  PREGNANCY, URINE     Status: None   Collection Time    08/19/13  2:28 PM      Result Value Range   Preg Test, Ur NEGATIVE  NEGATIVE    No results found.  Assessment/Plan: Menorrhagia.  Hysteroscopy, D&C and HTA Endometrial Ablation planned.  HARPER,CHARLES A 08/19/2013, 2:58 PM

## 2013-08-19 NOTE — Transfer of Care (Signed)
Immediate Anesthesia Transfer of Care Note  Patient: Madison Herrera  Procedure(s) Performed: Procedure(s): DILATATION & CURETTAGE/HYSTEROSCOPY WITH HYDROTHERMAL ABLATION (N/A)  Patient Location: PACU  Anesthesia Type:General  Level of Consciousness: awake, alert  and oriented  Airway & Oxygen Therapy: Patient Spontanous Breathing and Patient connected to nasal cannula oxygen  Post-op Assessment: Report given to PACU RN and Post -op Vital signs reviewed and stable  Post vital signs: Reviewed and stable  Complications: No apparent anesthesia complications

## 2013-08-19 NOTE — Anesthesia Preprocedure Evaluation (Signed)
Anesthesia Evaluation    Reviewed: Allergy & Precautions, H&P , NPO status , Patient's Chart, lab work & pertinent test results  Airway Mallampati: I TM Distance: >3 FB Neck ROM: full    Dental no notable dental hx. (+) Teeth Intact   Pulmonary neg pulmonary ROS,    Pulmonary exam normal       Cardiovascular negative cardio ROS      Neuro/Psych negative psych ROS   GI/Hepatic negative GI ROS, Neg liver ROS,   Endo/Other  negative endocrine ROS  Renal/GU negative Renal ROS  negative genitourinary   Musculoskeletal negative musculoskeletal ROS (+)   Abdominal Normal abdominal exam  (+)   Peds  Hematology negative hematology ROS (+)   Anesthesia Other Findings   Reproductive/Obstetrics negative OB ROS                           Anesthesia Physical Anesthesia Plan  ASA: II  Anesthesia Plan: General   Post-op Pain Management:    Induction: Intravenous  Airway Management Planned: LMA  Additional Equipment:   Intra-op Plan:   Post-operative Plan:   Informed Consent: I have reviewed the patients History and Physical, chart, labs and discussed the procedure including the risks, benefits and alternatives for the proposed anesthesia with the patient or authorized representative who has indicated his/her understanding and acceptance.     Plan Discussed with: CRNA and Surgeon  Anesthesia Plan Comments:         Anesthesia Quick Evaluation

## 2013-08-22 ENCOUNTER — Encounter (HOSPITAL_COMMUNITY): Payer: Self-pay | Admitting: Obstetrics

## 2013-09-01 ENCOUNTER — Ambulatory Visit (INDEPENDENT_AMBULATORY_CARE_PROVIDER_SITE_OTHER): Payer: Medicaid Other | Admitting: Obstetrics

## 2013-09-01 ENCOUNTER — Encounter: Payer: Self-pay | Admitting: Obstetrics

## 2013-09-01 VITALS — BP 122/83 | HR 77 | Temp 98.4°F | Ht 62.0 in | Wt 170.0 lb

## 2013-09-01 DIAGNOSIS — N920 Excessive and frequent menstruation with regular cycle: Secondary | ICD-10-CM

## 2013-09-01 DIAGNOSIS — R52 Pain, unspecified: Secondary | ICD-10-CM

## 2013-09-01 DIAGNOSIS — Z0000 Encounter for general adult medical examination without abnormal findings: Secondary | ICD-10-CM

## 2013-09-01 MED ORDER — PNV PRENATAL PLUS MULTIVITAMIN 27-1 MG PO TABS: 1.0000 | ORAL_TABLET | Freq: Every day | ORAL | Status: DC

## 2013-09-01 MED ORDER — OXYCODONE-ACETAMINOPHEN 10-325 MG PO TABS: 1.0000 | ORAL_TABLET | Freq: Four times a day (QID) | ORAL | Status: DC | PRN

## 2013-09-01 NOTE — Progress Notes (Signed)
.   Subjective:     Madison Herrera is a 42 y.o. female who presents to the clinic 2 weeks status post D & C - hydrothermal ablation for abnormal uterine bleeding. Eating a regular diet without difficulty. Bowel movements are normal. Pain is controlled with current analgesics. Medications being used: narcotic analgesics including Percocet.  The following portions of the patient's history were reviewed and updated as appropriate: allergies, current medications, past family history, past medical history, past social history, past surgical history and problem list.  Review of Systems Pertinent items are noted in HPI.    Objective:    BP 122/83  Pulse 77  Temp(Src) 98.4 F (36.9 C) (Oral)  Ht 5\' 2"  (1.575 m)  Wt 170 lb (77.111 kg)  BMI 31.09 kg/m2  LMP 07/14/2013 General:  alert and no distress  Abdomen: soft, bowel sounds active, non-tender  Incision:   none Uterus nontender     Assessment:    Doing well postoperatively. Operative findings again reviewed. Pathology report discussed.    Plan:    1. Continue any current medications. 2. Wound care discussed. 3. Activity restrictions: none 4. Anticipated return to work: now. 5. Follow up: 6 weeks for suture removal.

## 2013-09-02 LAB — WET PREP BY MOLECULAR PROBE
Candida species: NEGATIVE
Gardnerella vaginalis: NEGATIVE
Trichomonas vaginosis: NEGATIVE

## 2013-09-08 ENCOUNTER — Encounter: Payer: Medicaid Other | Admitting: Obstetrics

## 2013-09-09 ENCOUNTER — Telehealth: Payer: Self-pay | Admitting: *Deleted

## 2013-09-09 MED ORDER — METRONIDAZOLE 500 MG PO TABS: 500.0000 mg | ORAL_TABLET | Freq: Two times a day (BID) | ORAL | Status: DC

## 2013-09-09 NOTE — Telephone Encounter (Signed)
Call placed to patient, she was informed of lab results and medication per Dr Clearance Coots instructions. Allergies and medications reviewed with patient. Patient advised to call back if any changes.Patient has verbalized understanding and agrees as instructed.

## 2013-09-09 NOTE — Telephone Encounter (Signed)
Message copied by Glendell Docker on Fri Sep 09, 2013  1:48 PM ------      Message from: Coral Ceo A      Created: Fri Sep 02, 2013  8:58 AM       Flagyl 500mg  po bid x 7 days. ------

## 2013-09-29 ENCOUNTER — Encounter: Payer: Self-pay | Admitting: Obstetrics

## 2013-09-29 ENCOUNTER — Ambulatory Visit (INDEPENDENT_AMBULATORY_CARE_PROVIDER_SITE_OTHER): Payer: Medicaid Other | Admitting: Obstetrics

## 2013-09-29 VITALS — BP 130/84 | HR 56 | Temp 97.8°F | Ht 62.0 in | Wt 172.0 lb

## 2013-09-29 DIAGNOSIS — N920 Excessive and frequent menstruation with regular cycle: Secondary | ICD-10-CM

## 2013-09-29 NOTE — Progress Notes (Signed)
.   Subjective:     Madison Herrera is a 42 y.o. female who presents to the clinic 6 weeks status post D & C/ablation for abnormal uterine bleeding. Eating a regular diet without difficulty. Bowel movements are normal. The patient is not having any pain.  The following portions of the patient's history were reviewed and updated as appropriate: allergies, current medications, past family history, past medical history, past social history, past surgical history and problem list.  Review of Systems Pertinent items are noted in HPI.    Objective:    BP 130/84  Pulse 56  Temp(Src) 97.8 F (36.6 C) (Oral)  Ht 5\' 2"  (1.575 m)  Wt 172 lb (78.019 kg)  BMI 31.45 kg/m2  LMP 09/22/2013 General:  alert and no distress  Abdomen: soft, non-tender Uterus NSSC.  NT.        Assessment:    Doing well postoperatively. Operative findings again reviewed. Pathology report discussed.    Plan:    1. Continue any current medications. 2. Wound care discussed. 3. Activity restrictions: none 4. Anticipated return to work: now. 5. Follow up: several months for suture removal.

## 2013-09-30 ENCOUNTER — Encounter: Payer: Self-pay | Admitting: Obstetrics

## 2013-12-30 ENCOUNTER — Encounter: Payer: Self-pay | Admitting: Obstetrics & Gynecology

## 2014-09-18 ENCOUNTER — Encounter: Payer: Self-pay | Admitting: Obstetrics

## 2014-10-02 ENCOUNTER — Ambulatory Visit: Payer: Medicaid Other | Admitting: Obstetrics

## 2014-11-13 ENCOUNTER — Encounter: Payer: Self-pay | Admitting: *Deleted

## 2015-06-20 IMAGING — US US PELVIS COMPLETE
1 series · 14 of 25 positions shown · non-contrast
Comparison: None

CLINICAL DATA: Excessive/frequent menstruation.

TRANSABDOMINAL AND TRANSVAGINAL ULTRASOUND OF PELVIS
TECHNIQUE: Both transabdominal and transvaginal ultrasound
examinations of the pelvis were performed. Transabdominal technique
was performed for global imaging of the pelvis including uterus,
ovaries, adnexal regions, and pelvic cul-de-sac.
It was necessary to proceed with endovaginal exam following the
transabdominal exam to visualize the uterus, ovaries, and adnexa  .

[Series 1: us pelvis complete · 0.27mm/px · 14 of 73 slices shown]
[im 1/73]
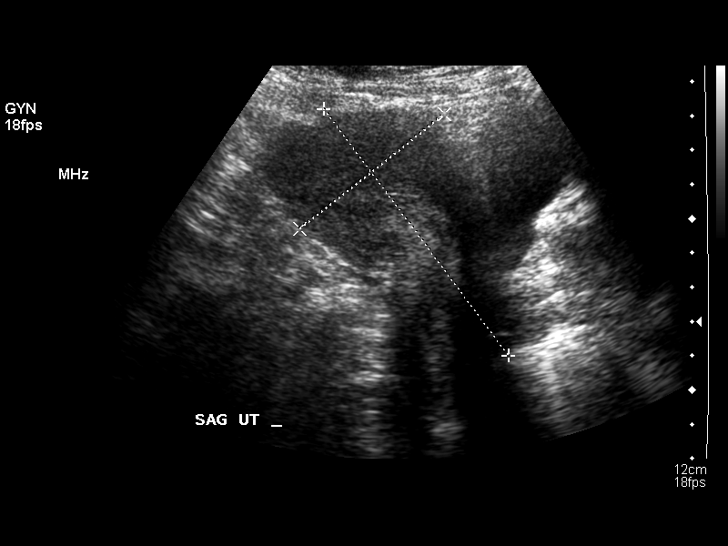
[im 7/73]
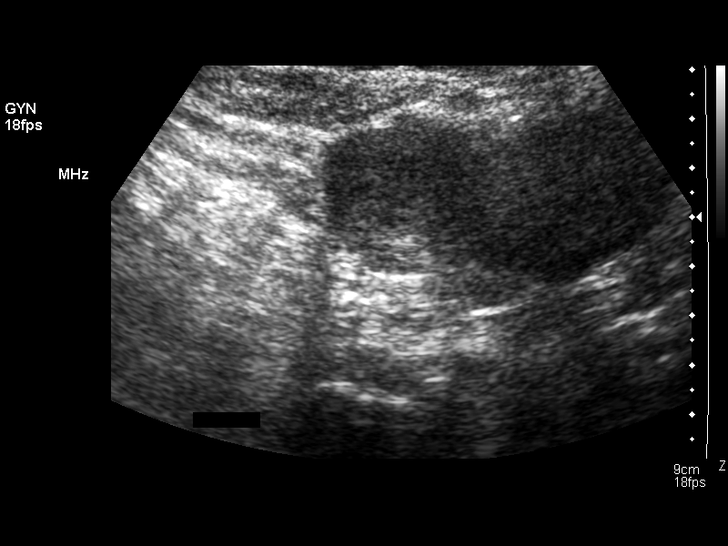
[im 13/73]
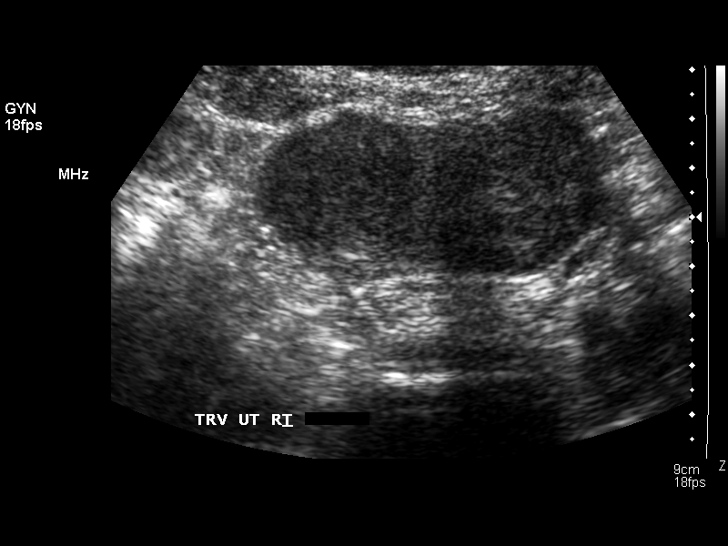
[im 19/73]
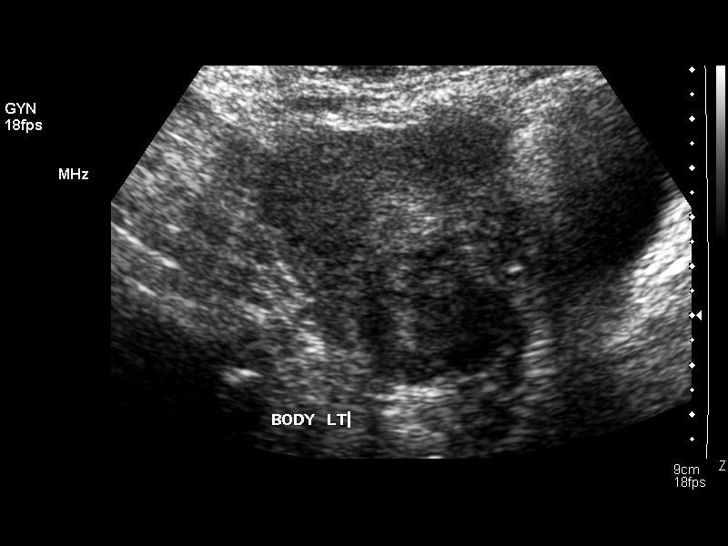
[im 25/73]
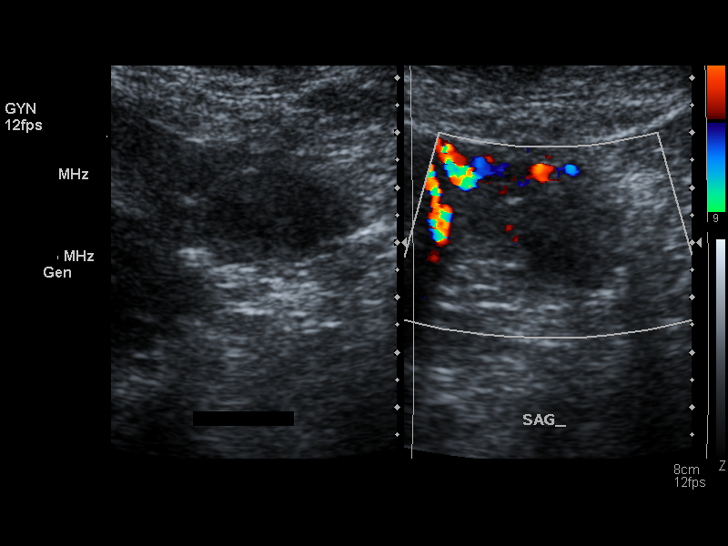
[im 28/73]
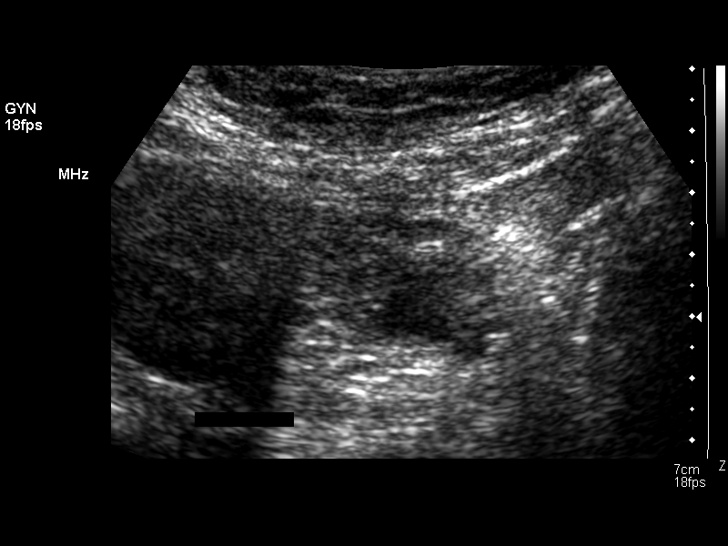
[im 34/73]
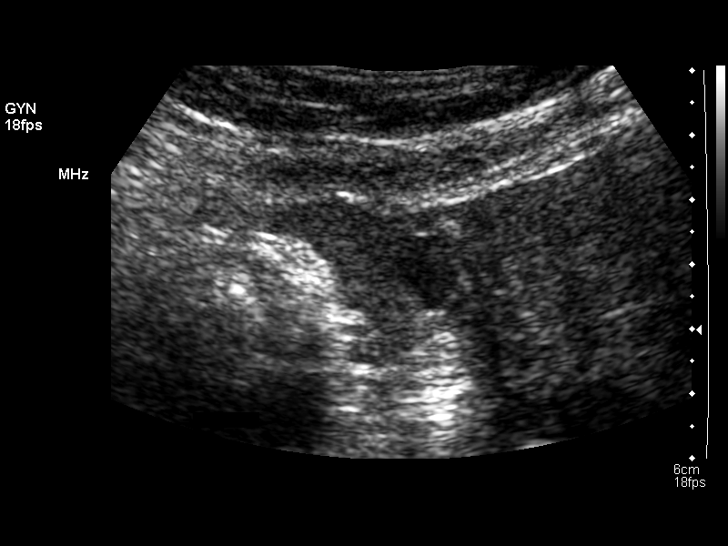
[im 40/73]
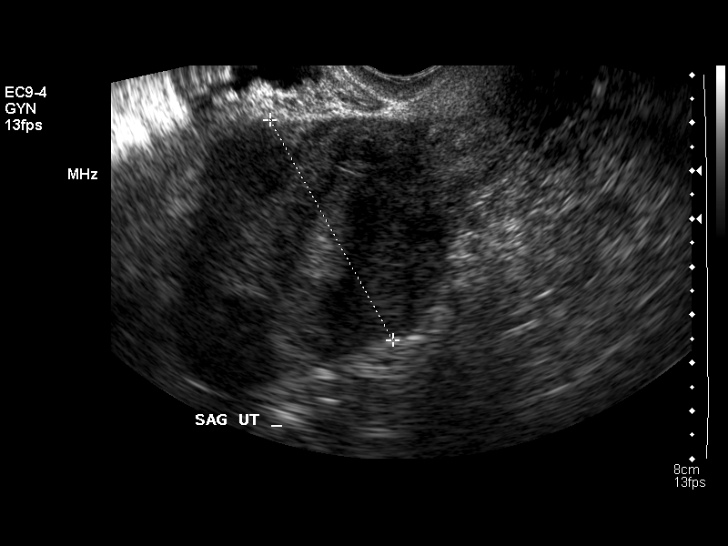
[im 46/73]
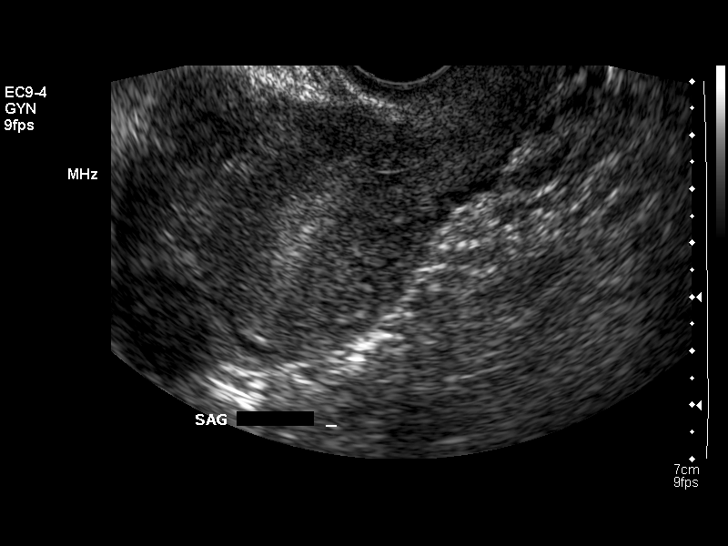
[im 49/73]
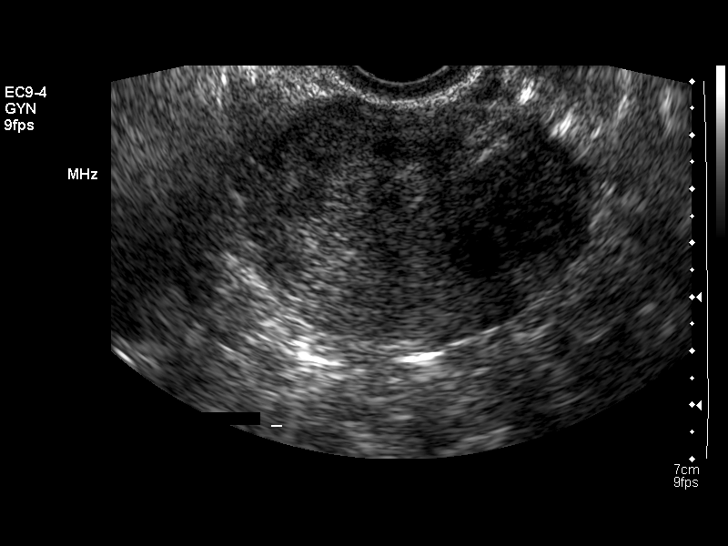
[im 55/73]
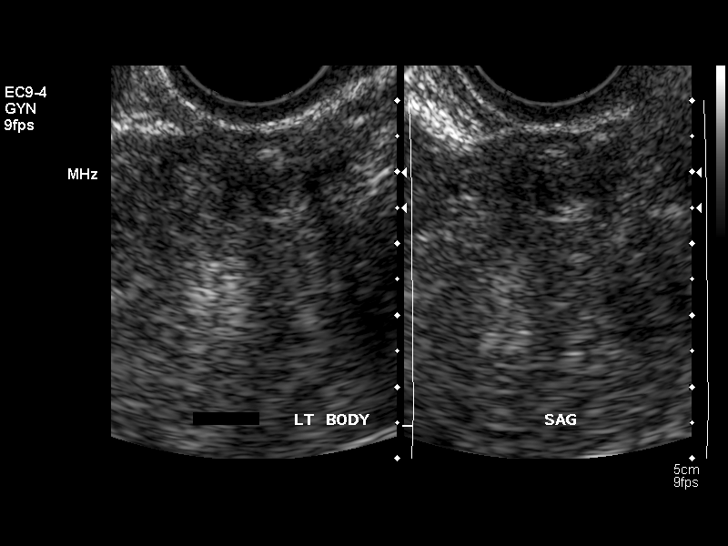
[im 61/73]
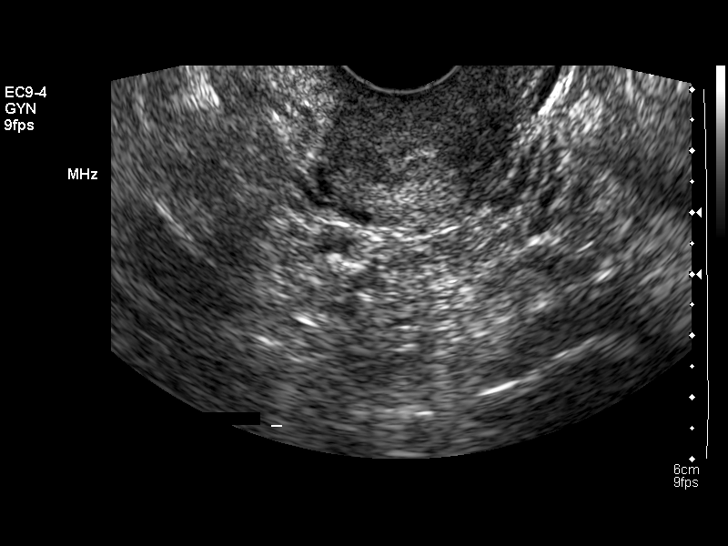
[im 67/73]
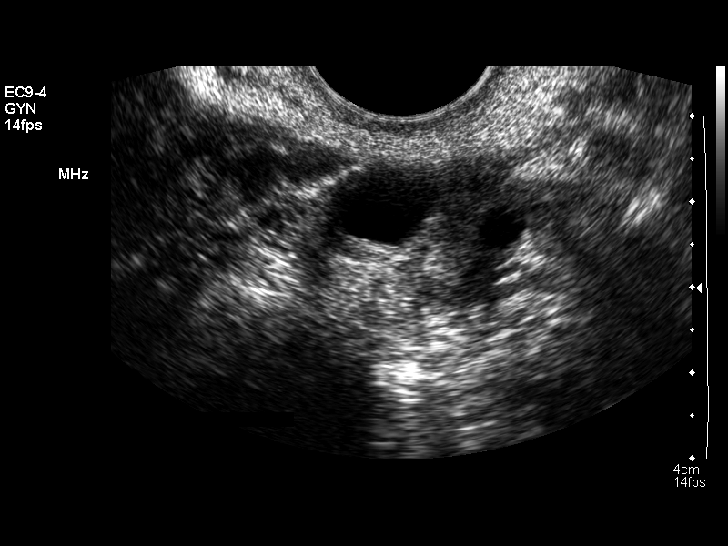
[im 73/73]
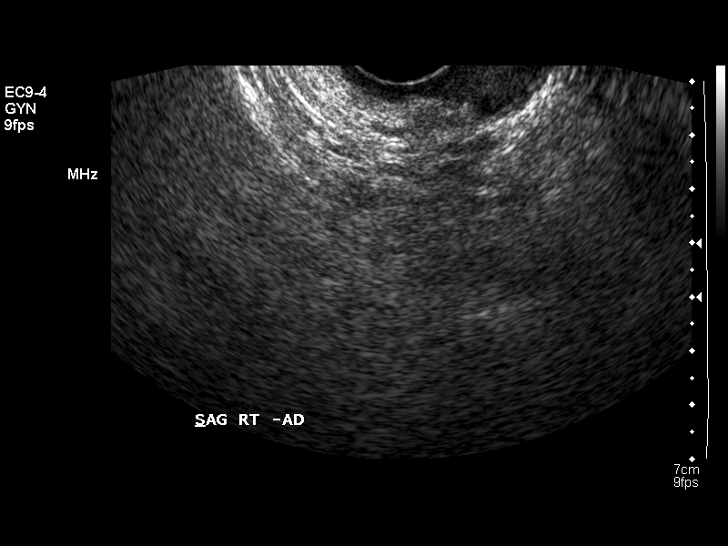

[14 of 25 positions shown; findings below may reference images not displayed]

FINDINGS: Uterus: 11.5 x 5.3 x 6.7 cm.  Multiple uterine fibroids.  A left
sided fundal lesion measures 3.1 x 2.4 x 2.2 cm.

Endometrium: Normal in visualized portions, 5 mm.

Right ovary:  3.4 x 2.7 x 1.7 cm. Normal in morphology.

Left ovary: 2.6 x 2.2 x 2.6 cm. Normal in morphology.

Other findings: No significant free fluid.
IMPRESSION: Uterine fibroids.

Otherwise, normal appearance of the pelvis..

## 2017-04-08 ENCOUNTER — Ambulatory Visit: Payer: Medicaid Other | Admitting: Primary Care

## 2017-04-09 ENCOUNTER — Telehealth: Payer: Self-pay | Admitting: Internal Medicine

## 2017-04-09 NOTE — Telephone Encounter (Signed)
Patient did not come in for their appointment on 04/08/17 for new pt  Please let me know if patient needs to be contacted immediately for follow up or no follow up needed. Do you want to charge the NSF?

## 2017-04-09 NOTE — Telephone Encounter (Signed)
No immediate follow up needed. No fee.

## 2017-04-29 ENCOUNTER — Encounter: Payer: Self-pay | Admitting: Primary Care

## 2017-04-29 ENCOUNTER — Ambulatory Visit (INDEPENDENT_AMBULATORY_CARE_PROVIDER_SITE_OTHER): Payer: 59 | Admitting: Primary Care

## 2017-04-29 ENCOUNTER — Ambulatory Visit (INDEPENDENT_AMBULATORY_CARE_PROVIDER_SITE_OTHER)
Admission: RE | Admit: 2017-04-29 | Discharge: 2017-04-29 | Disposition: A | Discharge status: Home / Self Care | Payer: 59 | Source: Ambulatory Visit | Attending: Primary Care | Admitting: Primary Care

## 2017-04-29 ENCOUNTER — Other Ambulatory Visit: Payer: Self-pay | Admitting: Primary Care

## 2017-04-29 VITALS — BP 134/84 | HR 76 | Temp 98.3°F | Ht 62.25 in | Wt 184.4 lb

## 2017-04-29 DIAGNOSIS — R350 Frequency of micturition: Secondary | ICD-10-CM | POA: Diagnosis not present

## 2017-04-29 DIAGNOSIS — M5442 Lumbago with sciatica, left side: Secondary | ICD-10-CM | POA: Diagnosis not present

## 2017-04-29 DIAGNOSIS — E119 Type 2 diabetes mellitus without complications: Secondary | ICD-10-CM | POA: Diagnosis not present

## 2017-04-29 DIAGNOSIS — G8929 Other chronic pain: Secondary | ICD-10-CM | POA: Diagnosis not present

## 2017-04-29 DIAGNOSIS — G4700 Insomnia, unspecified: Secondary | ICD-10-CM | POA: Insufficient documentation

## 2017-04-29 DIAGNOSIS — M545 Low back pain: Secondary | ICD-10-CM

## 2017-04-29 DIAGNOSIS — E876 Hypokalemia: Secondary | ICD-10-CM

## 2017-04-29 LAB — POC URINALSYSI DIPSTICK (AUTOMATED)
Bilirubin, UA: NEGATIVE
Blood, UA: NEGATIVE
Leukocytes, UA: NEGATIVE
Nitrite, UA: NEGATIVE
Spec Grav, UA: >=1.03 — AB (ref 1.010–1.025)
UROBILINOGEN UA: 1 U/dL
pH, UA: 6 (ref 5.0–8.0)

## 2017-04-29 LAB — COMPREHENSIVE METABOLIC PANEL
ALT: 10 U/L (ref 0–35)
AST: 11 U/L (ref 0–37)
Albumin: 4.3 g/dL (ref 3.5–5.2)
Alkaline Phosphatase: 58 U/L (ref 39–117)
BILIRUBIN TOTAL: 0.8 mg/dL (ref 0.2–1.2)
BUN: 9 mg/dL (ref 6–23)
CALCIUM: 9.6 mg/dL (ref 8.4–10.5)
CO2: 27 meq/L (ref 19–32)
Chloride: 107 mEq/L (ref 96–112)
Creatinine, Ser: 0.58 mg/dL (ref 0.40–1.20)
GFR: 143.06 mL/min (ref >60.00)
Glucose, Bld: 134 mg/dL — ABNORMAL HIGH (ref 70–99)
Potassium: 3.2 mEq/L — ABNORMAL LOW (ref 3.5–5.1)
Sodium: 139 mEq/L (ref 135–145)
TOTAL PROTEIN: 7.1 g/dL (ref 6.0–8.3)

## 2017-04-29 LAB — HEMOGLOBIN A1C: Hgb A1c MFr Bld: 6 % (ref 4.6–6.5)

## 2017-04-29 MED ORDER — FLUTICASONE PROPIONATE 50 MCG/ACT NA SUSP: 2.0000 | Freq: Every day | NASAL | 1 refills | Status: DC

## 2017-04-29 MED ORDER — FEXOFENADINE HCL 30 MG PO TABS: 30.0000 mg | ORAL_TABLET | Freq: Every day | ORAL | 1 refills | Status: DC

## 2017-04-29 MED ORDER — PREDNISONE 10 MG PO TABS: ORAL_TABLET | ORAL | 0 refills | Status: DC

## 2017-04-29 MED ORDER — POTASSIUM CHLORIDE CRYS ER 20 MEQ PO TBCR: 20.0000 meq | EXTENDED_RELEASE_TABLET | Freq: Two times a day (BID) | ORAL | 0 refills | Status: DC

## 2017-04-29 MED ORDER — MIRTAZAPINE 7.5 MG PO TABS: 7.5000 mg | ORAL_TABLET | Freq: Every day | ORAL | 0 refills | Status: DC

## 2017-04-29 MED ORDER — NAPROXEN 500 MG PO TABS: ORAL_TABLET | ORAL | 0 refills | Status: DC

## 2017-04-29 NOTE — Assessment & Plan Note (Signed)
Chronic since the early 1990s. Symptoms today suggest acute flare especially given radiculopathy. Will obtain plain films of her lumbar spine today. Short-term low-dose prednisone course sent to pharmacy. Prescription for naproxen sent to pharmacy, instructed not to use with prednisone. Consider physical therapy once x-ray has returned.

## 2017-04-29 NOTE — Addendum Note (Signed)
Addended by: Jacqualin Combes on: 04/29/2017 06:55 PM   Modules accepted: Orders

## 2017-04-29 NOTE — Assessment & Plan Note (Signed)
Present for 3 years since the murder of her family. She has failed several OTC options, could not tolerate trazodone, did do well on Ambien. Will start with low-dose Remeron given mild depression and insomnia. We will call her in a few weeks for an update.

## 2017-04-29 NOTE — Patient Instructions (Signed)
Complete lab work and xray prior to leaving today.   Start prednisone tablets. Take three tablets for 2 days, then two tablets for 2 days, then one tablet for 2 days.  You may take Naproxen twice daily with food as needed for pain. Do not take this with the prednisone.  Start mirtazapine 7.5 mg tablets for insomnia. Take 1 tablet by mouth at bedtime.   I'll be in touch regarding your lab and xray results.  It was a pleasure to meet you today! Please don't hesitate to call me with any questions. Welcome to [facility]!

## 2017-04-29 NOTE — Progress Notes (Signed)
Subjective:    Patient ID: Madison Herrera, female    DOB: 01-Nov-1971, 46 y.o.   MRN: [id number]  HPI  [name] is a 46 year old female who presents today to establish care and discuss the problems mentioned below. Will obtain old records.  1) Back Pain: Located to the bilateral lower back. History of left sided sciatica since her epidural in 1992. Sciatic is intermittent, mostly once monthly that will last 2 weeks. She was previously managed on prednisone and naproxen courses for acute flares with improvement. She thinks she had an MRI 3-4 years ago but is uncertain of the test results. Now she's noticed that her gait is disturbed and has noticed some weakness. She has a prescription of Flexeril for which doesn't help much.   2) Insomnia: Difficulty falling and staying asleep since the loss of her daughter and grandchild after they were murdered. This occurred three years ago. She's tried Ambien in the past with improvement. She was once on Trazodone once which caused vivid dreams, nightmares. She will take Melatonin with some improvement, but will wake up after 4 hours. Before bed she reads, exercises occasionally, goes to bed with her TV on, listens to nature sounds. She drinks one cup of coffee daily, typically no caffeine after 12 PM. She does have intermittent feelings of depression for which she manages on her own.  3) Type 2 Diabetes: History of this in the past and has been off of medications for 17 years. She was able to control her diabetes through healthy diet and exercise. Over the past 1 month she's noticed urinary urgency and frequency. She denies hematuria, dysuria. She does have a history of neuropathy and was once managed on gabapentin.   Review of Systems  Constitutional: Negative for fatigue.  Eyes: Negative for visual disturbance.  Respiratory: Negative for shortness of breath.   Cardiovascular: Negative for chest pain.  Gastrointestinal: Negative for nausea.  Endocrine:  Positive for polyuria.  Genitourinary: Positive for urgency.  Musculoskeletal: Positive for back pain.  Skin: Negative for color change.  Neurological: Positive for numbness.  Psychiatric/Behavioral: Positive for sleep disturbance.       Past Medical History:  Diagnosis Date  . Allergic rhinitis   . Allergy   . Neuromuscular disorder ([place])    hand and feet -  tx with neurotin  . Neuropathy   . Type 2 diabetes mellitus ([place])      Social History   Social History  . Marital status: Divorced    Spouse name: N/A  . Number of children: N/A  . Years of education: N/A   Occupational History  . Not on file.   Social History Main Topics  . Smoking status: Former Smoker    Packs/day: 0.25    Years: 0.50    Types: Cigarettes  . Smokeless tobacco: Never Used  . Alcohol use Yes     Comment: occ  . Drug use: No  . Sexual activity: Not Currently    Partners: Male    Birth control/ protection: Surgical   Other Topics Concern  . Not on file   Social History Narrative   Divorced.   2 daughters, 1 son.   Self employed.   Enjoys spending time at [employer].     Past Surgical History:  Procedure Laterality Date  . BTL    . CESAREAN SECTION     x 2  . DILITATION & CURRETTAGE/HYSTROSCOPY WITH HYDROTHERMAL ABLATION N/A 08/19/2013   Procedure: DILATATION &  CURETTAGE/HYSTEROSCOPY WITH HYDROTHERMAL ABLATION;  Surgeon: Shelly Bombard, MD;  Location: [facility];  Service: Gynecology;  Laterality: N/A;  . TONSILLECTOMY AND ADENOIDECTOMY     age 29    Family History  Problem Relation Age of Onset  . Cancer - Lung Mother   . Diabetes Mother   . Diabetes Father   . Hypertension Father   . Cancer - Colon Brother   . Stomach cancer Brother   . Uterine cancer Paternal Aunt   . Healthy Brother   . Healthy Sister        x2  . Heart disease Neg Hx     Allergies  Allergen Reactions  . Latex Rash    fever  . Penicillins Rash  . Trazodone And Nefazodone Other (See Comments)     Vivid dreams/nightmares    Current Outpatient Prescriptions on File Prior to Visit  Medication Sig Dispense Refill  . cyclobenzaprine (FLEXERIL) 5 MG tablet Take 5 mg by mouth 2 (two) times daily as needed for muscle spasms.    . fluticasone (FLONASE) 50 MCG/ACT nasal spray Place 2 sprays into the nose daily.     No current facility-administered medications on file prior to visit.     BP 134/84   Pulse 76   Temp 98.3 F (36.8 C) (Oral)   Ht 5' 2.25" (1.581 m)   Wt 184 lb 6.4 oz (83.6 kg)   SpO2 98%   BMI 33.46 kg/m    Objective:   Physical Exam  Constitutional: She appears well-nourished.  Neck: Neck supple.  Cardiovascular: Normal rate and regular rhythm.   Pulmonary/Chest: Effort normal and breath sounds normal.  Abdominal: Soft.  Musculoskeletal:       Lumbar back: She exhibits pain. She exhibits normal range of motion and no bony tenderness.  Negative straight leg raise bilaterally  Skin: Skin is warm and dry.  Psychiatric: She has a normal mood and affect.          Assessment & Plan:

## 2017-04-29 NOTE — Assessment & Plan Note (Signed)
Diagnosed years ago, diet controlled. UA today with 3+ glucose, add A1c today. Urinalysis without evidence of infection. Will treat based off of A1c result.

## 2017-05-05 ENCOUNTER — Encounter: Payer: Self-pay | Admitting: Primary Care

## 2017-05-06 MED ORDER — FEXOFENADINE HCL 30 MG PO TABS: 30.0000 mg | ORAL_TABLET | Freq: Every day | ORAL | 5 refills | Status: DC

## 2017-05-14 ENCOUNTER — Encounter: Payer: Self-pay | Admitting: Primary Care

## 2017-05-15 ENCOUNTER — Encounter: Payer: Self-pay | Admitting: Internal Medicine

## 2017-05-15 ENCOUNTER — Encounter: Payer: Self-pay | Admitting: Primary Care

## 2017-05-15 ENCOUNTER — Ambulatory Visit (INDEPENDENT_AMBULATORY_CARE_PROVIDER_SITE_OTHER): Payer: 59 | Admitting: Internal Medicine

## 2017-05-15 VITALS — BP 128/82 | HR 76 | Temp 97.9°F | Wt 184.0 lb

## 2017-05-15 DIAGNOSIS — B373 Candidiasis of vulva and vagina: Secondary | ICD-10-CM | POA: Diagnosis not present

## 2017-05-15 DIAGNOSIS — B3731 Acute candidiasis of vulva and vagina: Secondary | ICD-10-CM

## 2017-05-15 DIAGNOSIS — L298 Other pruritus: Secondary | ICD-10-CM

## 2017-05-15 DIAGNOSIS — N898 Other specified noninflammatory disorders of vagina: Secondary | ICD-10-CM

## 2017-05-15 MED ORDER — FEXOFENADINE HCL 60 MG PO TABS: 60.0000 mg | ORAL_TABLET | Freq: Two times a day (BID) | ORAL | 5 refills | Status: DC

## 2017-05-15 MED ORDER — FLUCONAZOLE 150 MG PO TABS: 150.0000 mg | ORAL_TABLET | Freq: Once | ORAL | 0 refills | Status: AC

## 2017-05-15 NOTE — Patient Instructions (Signed)

## 2017-05-15 NOTE — Progress Notes (Signed)
Subjective:    Patient ID: Madison Herrera, female    DOB: [birth year], 46 y.o.   MRN: [id number]  HPI  Pt presents to the clinic today with c/o vaginal itching. She reports this started 4-5 days ago. She denies vaginal discharge, odor or abnormal bleeding. She denies pelvic pain or urinary symptoms. She has not tried anything OTC for this. She reports she was recently on Prednisone and given her history of DM 2, this often causes her to get a yeast infection.  Review of Systems      Past Medical History:  Diagnosis Date  . Allergic rhinitis   . Allergy   . Neuromuscular disorder ([place])    hand and feet -  tx with neurotin  . Neuropathy   . Type 2 diabetes mellitus ([place])     Current Outpatient Prescriptions  Medication Sig Dispense Refill  . cyclobenzaprine (FLEXERIL) 5 MG tablet Take 5 mg by mouth 2 (two) times daily as needed for muscle spasms.    . fexofenadine (ALLEGRA) 30 MG tablet Take 1 tablet (30 mg total) by mouth daily. 30 tablet 5  . fluticasone (FLONASE) 50 MCG/ACT nasal spray Place 2 sprays into both nostrils daily. 16 g 1  . mirtazapine (REMERON) 7.5 MG tablet Take 1 tablet (7.5 mg total) by mouth at bedtime. 30 tablet 0  . naproxen (NAPROSYN) 500 MG tablet Take 1 tablet by mouth twice daily with food as needed for pain. 30 tablet 0  . potassium chloride SA (K-DUR,KLOR-CON) 20 MEQ tablet Take 1 tablet (20 mEq total) by mouth 2 (two) times daily. 8 tablet 0   No current facility-administered medications for this visit.     Allergies  Allergen Reactions  . Latex Rash    fever  . Penicillins Rash  . Trazodone And Nefazodone Other (See Comments)    Vivid dreams/nightmares    Family History  Problem Relation Age of Onset  . Cancer - Lung Mother   . Diabetes Mother   . Diabetes Father   . Hypertension Father   . Cancer - Colon Brother   . Stomach cancer Brother   . Uterine cancer Paternal Aunt   . Healthy Brother   . Healthy Sister        x2  . Heart  disease Neg Hx     Social History   Social History  . Marital status: Divorced    Spouse name: N/A  . Number of children: N/A  . Years of education: N/A   Occupational History  . Not on file.   Social History Main Topics  . Smoking status: Former Smoker    Packs/day: 0.25    Years: 0.50    Types: Cigarettes  . Smokeless tobacco: Never Used  . Alcohol use Yes     Comment: occ  . Drug use: No  . Sexual activity: Not Currently    Partners: Male    Birth control/ protection: Surgical   Other Topics Concern  . Not on file   Social History Narrative   Divorced.   2 daughters, 1 son.   Self employed.   Enjoys spending time at [employer].      Constitutional: Denies fever, malaise, fatigue, headache or abrupt weight changes.  Gastrointestinal: Denies abdominal pain, bloating, constipation, diarrhea or blood in the stool.  GU: Pt reports vaginal itching. Denies urgency, frequency, pain with urination, burning sensation, blood in urine, odor or discharge.   No other specific complaints in a complete review of  systems (except as listed in HPI above).  Objective:   Physical Exam   BP 128/82   Pulse 76   Temp 97.9 F (36.6 C) (Oral)   Wt 184 lb (83.5 kg)   SpO2 98%   BMI 33.38 kg/m  Wt Readings from Last 3 Encounters:  05/15/17 184 lb (83.5 kg)  04/29/17 184 lb 6.4 oz (83.6 kg)  09/29/13 172 lb (78 kg)    General: Appears her stated age, in NAD. Abdomen: Soft and nontender. Normal bowel sounds. No distention or masses noted.  Pelvic: Pt self swabbed.   BMET    Component Value Date/Time   NA 139 04/29/2017 1114   K 3.2 (L) 04/29/2017 1114   CL 107 04/29/2017 1114   CO2 27 04/29/2017 1114   GLUCOSE 134 (H) 04/29/2017 1114   BUN 9 04/29/2017 1114   CREATININE 0.58 04/29/2017 1114   CALCIUM 9.6 04/29/2017 1114    Lipid Panel  No results found for: CHOL, TRIG, HDL, CHOLHDL, VLDL, LDLCALC  CBC    Component Value Date/Time   WBC 9.3 08/19/2013 1429    RBC 4.04 08/19/2013 1429   HGB 13.1 08/19/2013 1429   HCT 38.9 08/19/2013 1429   PLT 271 08/19/2013 1429   MCV 96.3 08/19/2013 1429   MCH 32.4 08/19/2013 1429   MCHC 33.7 08/19/2013 1429   RDW 12.6 08/19/2013 1429    Hgb A1C Lab Results  Component Value Date   HGBA1C 6.0 04/29/2017           Assessment & Plan:   Vaginal Itching secondary to Vaginal Candidiasis:  Wet prep: + yeast eRx for Diflucan 150 mg PO x 1 now, repeat in 3 days  Return precautions discussed Webb Silversmith, NP

## 2017-05-21 MED ORDER — FEXOFENADINE HCL 60 MG PO TABS: 60.0000 mg | ORAL_TABLET | Freq: Two times a day (BID) | ORAL | 5 refills | Status: DC

## 2017-07-14 ENCOUNTER — Encounter: Payer: Self-pay | Admitting: Primary Care

## 2017-07-15 ENCOUNTER — Encounter: Payer: Self-pay | Admitting: Primary Care

## 2017-07-15 ENCOUNTER — Ambulatory Visit (INDEPENDENT_AMBULATORY_CARE_PROVIDER_SITE_OTHER): Payer: 59 | Admitting: Primary Care

## 2017-07-15 VITALS — BP 124/80 | HR 80 | Temp 98.2°F | Ht 62.25 in | Wt 175.1 lb

## 2017-07-15 DIAGNOSIS — N898 Other specified noninflammatory disorders of vagina: Secondary | ICD-10-CM

## 2017-07-15 DIAGNOSIS — E119 Type 2 diabetes mellitus without complications: Secondary | ICD-10-CM | POA: Diagnosis not present

## 2017-07-15 DIAGNOSIS — R358 Other polyuria: Secondary | ICD-10-CM

## 2017-07-15 DIAGNOSIS — R3589 Other polyuria: Secondary | ICD-10-CM

## 2017-07-15 DIAGNOSIS — L298 Other pruritus: Secondary | ICD-10-CM | POA: Diagnosis not present

## 2017-07-15 LAB — POC URINALSYSI DIPSTICK (AUTOMATED)
BILIRUBIN UA: NEGATIVE
LEUKOCYTES UA: NEGATIVE
NITRITE UA: NEGATIVE
RBC UA: NEGATIVE
Spec Grav, UA: >=1.03 — AB (ref 1.010–1.025)
Urobilinogen, UA: 1 E.U./dL
pH, UA: 6 (ref 5.0–8.0)

## 2017-07-15 MED ORDER — GLUCOSE BLOOD VI STRP: ORAL_STRIP | 5 refills | Status: DC

## 2017-07-15 MED ORDER — ONETOUCH ULTRASOFT LANCETS MISC: 5 refills | Status: DC

## 2017-07-15 MED ORDER — FLUCONAZOLE 150 MG PO TABS: ORAL_TABLET | ORAL | 1 refills | Status: DC

## 2017-07-15 MED ORDER — ONETOUCH ULTRA 2 W/DEVICE KIT: PACK | 0 refills | Status: DC

## 2017-07-15 MED ORDER — METFORMIN HCL ER 500 MG PO TB24: 500.0000 mg | ORAL_TABLET | Freq: Every day | ORAL | 0 refills | Status: DC

## 2017-07-15 NOTE — Telephone Encounter (Signed)
Pt called because meds requested are not at [address]. On med list appeared meds and diabetic supplies went to [facility] [address]. I spoke with Maudie Mercury at [facility] and they did received rxs and are in process of getting ready for pick up. Request pt to contact [place]. Pt voiced understanding and pt is at [address] and will speak with pharmacy now. Nothing further needed.

## 2017-07-15 NOTE — Assessment & Plan Note (Addendum)
Symptoms represent a return in diabetes, repeat A1C due in 2 weeks. Given elevated glucose readings without significant change in diet or stress, will treat with Metformin XR 500 mg once daily. May need to increase based off of A1C result. UA with ketones and glucose. Last fasting reading was in the mid 200's this morning.

## 2017-07-15 NOTE — Progress Notes (Signed)
Subjective:    Patient ID: Madison Herrera, female    DOB: 1970-12-09, 46 y.o.   MRN: [id number]  HPI  Madison Herrera is a 46 year old female with a history of type 2 diabetes who presents today with a chief complaint of hyperglycemia. She also reports vaginal discharge and itching. Her last A1C was 6.0 in mid June 2018. She has a history of diabetes that has been diet controlled for the last 17 years without medication.   She checked her fasting blood sugar three days ago after feeling "bad" which was in the low 300's. Since then she's been seeing numbers fasting in the mid 200's. She also notices polyuria and polydipsia. She was previously managed on Metformin in the past and did experience diarrhea. She was also on several other medications in the past but can't remember.   She's also experiencing vaginal discharge (whitish, thick) and vaginal itching that began 4-5 days ago. She was last treated with Diflucan for vaginal yeast infection in late June 2018.   Review of Systems  Constitutional: Negative for fever.  Endocrine: Positive for polydipsia and polyuria.  Genitourinary: Positive for dysuria and vaginal discharge. Negative for flank pain and hematuria.  Neurological: Negative for dizziness.       Past Medical History:  Diagnosis Date  . Allergic rhinitis   . Allergy   . Neuromuscular disorder ([place])    hand and feet -  tx with neurotin  . Neuropathy   . Type 2 diabetes mellitus ([place])      Social History   Social History  . Marital status: Divorced    Spouse name: N/A  . Number of children: N/A  . Years of education: N/A   Occupational History  . Not on file.   Social History Main Topics  . Smoking status: Former Smoker    Packs/day: 0.25    Years: 0.50    Types: Cigarettes  . Smokeless tobacco: Never Used  . Alcohol use Yes     Comment: occ  . Drug use: No  . Sexual activity: Not Currently    Partners: Male    Birth control/ protection: Surgical   Other  Topics Concern  . Not on file   Social History Narrative   Divorced.   2 daughters, 1 son.   Self employed.   Enjoys spending time at [employer].     Past Surgical History:  Procedure Laterality Date  . BTL    . CESAREAN SECTION     x 2  . DILITATION & CURRETTAGE/HYSTROSCOPY WITH HYDROTHERMAL ABLATION N/A 08/19/2013   Procedure: DILATATION & CURETTAGE/HYSTEROSCOPY WITH HYDROTHERMAL ABLATION;  Surgeon: Shelly Bombard, MD;  Location: [facility];  Service: Gynecology;  Laterality: N/A;  . TONSILLECTOMY AND ADENOIDECTOMY     age 88    Family History  Problem Relation Age of Onset  . Cancer - Lung Mother   . Diabetes Mother   . Diabetes Father   . Hypertension Father   . Cancer - Colon Brother   . Stomach cancer Brother   . Uterine cancer Paternal Aunt   . Healthy Brother   . Healthy Sister        x2  . Heart disease Neg Hx     Allergies  Allergen Reactions  . Latex Rash    fever  . Penicillins Rash  . Trazodone And Nefazodone Other (See Comments)    Vivid dreams/nightmares    Current Outpatient Prescriptions on File Prior to Visit  Medication Sig Dispense Refill  . cyclobenzaprine (FLEXERIL) 5 MG tablet Take 5 mg by mouth 2 (two) times daily as needed for muscle spasms.    . fexofenadine (ALLEGRA) 60 MG tablet Take 1 tablet (60 mg total) by mouth 2 (two) times daily. 30 tablet 5  . fluticasone (FLONASE) 50 MCG/ACT nasal spray Place 2 sprays into both nostrils daily. 16 g 1  . mirtazapine (REMERON) 7.5 MG tablet Take 1 tablet (7.5 mg total) by mouth at bedtime. 30 tablet 0  . naproxen (NAPROSYN) 500 MG tablet Take 1 tablet by mouth twice daily with food as needed for pain. 30 tablet 0  . potassium chloride SA (K-DUR,KLOR-CON) 20 MEQ tablet Take 1 tablet (20 mEq total) by mouth 2 (two) times daily. 8 tablet 0   No current facility-administered medications on file prior to visit.     BP 124/80   Pulse 80   Temp 98.2 F (36.8 C) (Oral)   Ht 5' 2.25" (1.581 m)   Wt  175 lb 1.9 oz (79.4 kg)   SpO2 99%   BMI 31.77 kg/m    Objective:   Physical Exam  Constitutional: She appears well-nourished.  Neck: Neck supple.  Cardiovascular: Normal rate and regular rhythm.   Pulmonary/Chest: Effort normal and breath sounds normal.  Genitourinary: There is no tenderness or lesion on the right labia. There is no tenderness or lesion on the left labia. Cervix exhibits discharge. Cervix exhibits no motion tenderness. No erythema in the vagina. Vaginal discharge found.  Genitourinary Comments: Moderate amount of thick whitish discharge   Skin: Skin is warm and dry.          Assessment & Plan:  Vaginal Discharge:  Also with itching x 4-5 days. Suspect secondary to hyperglycemia.  Wet prep sent off for testing. Rule out BV. Exam very suspicious for yeast involvement. Rx for Diflucan sent to pharmacy. UA today: 2+ ketones, 3+ glucose. Negative leuks, blood, and nitrites.   Sheral Flow, NP

## 2017-07-15 NOTE — Patient Instructions (Signed)
Start Metformin ER 500 mg tablet for diabetes. Take 1 tablet by mouth every morning with breakfast. Please call me if you experience diarrhea for more than 2 weeks.  Start diflucan 150 mg tablet for presumed yeast infection. Take 1 tablet by mouth once, may repeat in 3 days if no resolve.   Schedule a lab only appointment in 2 weeks to recheck your A1C. I'll be in touch with you once I receive these results.   It was a pleasure to see you today!  Diabetes Mellitus and Food It is important for you to manage your blood sugar (glucose) level. Your blood glucose level can be greatly affected by what you eat. Eating healthier foods in the appropriate amounts throughout the day at about the same time each day will help you control your blood glucose level. It can also help slow or prevent worsening of your diabetes mellitus. Healthy eating may even help you improve the level of your blood pressure and reach or maintain a healthy weight. General recommendations for healthful eating and cooking habits include:  Eating meals and snacks regularly. Avoid going long periods of time without eating to lose weight.  Eating a diet that consists mainly of plant-based foods, such as fruits, vegetables, nuts, legumes, and whole grains.  Using low-heat cooking methods, such as baking, instead of high-heat cooking methods, such as deep frying.  Work with your dietitian to make sure you understand how to use the Nutrition Facts information on food labels. How can food affect me? Carbohydrates Carbohydrates affect your blood glucose level more than any other type of food. Your dietitian will help you determine how many carbohydrates to eat at each meal and teach you how to count carbohydrates. Counting carbohydrates is important to keep your blood glucose at a healthy level, especially if you are using insulin or taking certain medicines for diabetes mellitus. Alcohol Alcohol can cause sudden decreases in blood  glucose (hypoglycemia), especially if you use insulin or take certain medicines for diabetes mellitus. Hypoglycemia can be a life-threatening condition. Symptoms of hypoglycemia (sleepiness, dizziness, and disorientation) are similar to symptoms of having too much alcohol. If your health care provider has given you approval to drink alcohol, do so in moderation and use the following guidelines:  Women should not have more than one drink per day, and men should not have more than two drinks per day. One drink is equal to: ? 12 oz of beer. ? 5 oz of wine. ? 1 oz of hard liquor.  Do not drink on an empty stomach.  Keep yourself hydrated. Have water, diet soda, or unsweetened iced tea.  Regular soda, juice, and other mixers might contain a lot of carbohydrates and should be counted.  What foods are not recommended? As you make food choices, it is important to remember that all foods are not the same. Some foods have fewer nutrients per serving than other foods, even though they might have the same number of calories or carbohydrates. It is difficult to get your body what it needs when you eat foods with fewer nutrients. Examples of foods that you should avoid that are high in calories and carbohydrates but low in nutrients include:  Trans fats (most processed foods list trans fats on the Nutrition Facts label).  Regular soda.  Juice.  Candy.  Sweets, such as cake, pie, doughnuts, and cookies.  Fried foods.  What foods can I eat? Eat nutrient-rich foods, which will nourish your body and keep you healthy.  The food you should eat also will depend on several factors, including:  The calories you need.  The medicines you take.  Your weight.  Your blood glucose level.  Your blood pressure level.  Your cholesterol level.  You should eat a variety of foods, including:  Protein. ? Lean cuts of meat. ? Proteins low in saturated fats, such as fish, egg whites, and beans. Avoid  processed meats.  Fruits and vegetables. ? Fruits and vegetables that may help control blood glucose levels, such as apples, mangoes, and yams.  Dairy products. ? Choose fat-free or low-fat dairy products, such as milk, yogurt, and cheese.  Grains, bread, pasta, and rice. ? Choose whole grain products, such as multigrain bread, whole oats, and brown rice. These foods may help control blood pressure.  Fats. ? Foods containing healthful fats, such as nuts, avocado, olive oil, canola oil, and fish.  Does everyone with diabetes mellitus have the same meal plan? Because every person with diabetes mellitus is different, there is not one meal plan that works for everyone. It is very important that you meet with a dietitian who will help you create a meal plan that is just right for you. This information is not intended to replace advice given to you by your health care provider. Make sure you discuss any questions you have with your health care provider. Document Released: 07/31/2005 Document Revised: 04/10/2016 Document Reviewed: 09/30/2013 Elsevier Interactive Patient Education  2017 [employer].

## 2017-07-16 ENCOUNTER — Other Ambulatory Visit: Payer: Self-pay | Admitting: Primary Care

## 2017-07-16 DIAGNOSIS — N760 Acute vaginitis: Principal | ICD-10-CM

## 2017-07-16 DIAGNOSIS — B9689 Other specified bacterial agents as the cause of diseases classified elsewhere: Secondary | ICD-10-CM

## 2017-07-16 LAB — WET PREP BY MOLECULAR PROBE
CANDIDA SPECIES: NOT DETECTED
GARDNERELLA VAGINALIS: DETECTED — AB
TRICHOMONAS VAG: NOT DETECTED

## 2017-07-16 MED ORDER — METRONIDAZOLE 500 MG PO TABS: 500.0000 mg | ORAL_TABLET | Freq: Two times a day (BID) | ORAL | 0 refills | Status: DC

## 2017-07-17 ENCOUNTER — Telehealth: Payer: Self-pay | Admitting: Primary Care

## 2017-07-17 NOTE — Telephone Encounter (Signed)
Prior auth request for metroNIDAZOLE (FLAGYL) 500 MG tablet. I have sent the request earlier today. Waiting on rspond.

## 2017-07-24 ENCOUNTER — Other Ambulatory Visit: Payer: Self-pay | Admitting: Primary Care

## 2017-07-24 ENCOUNTER — Encounter: Payer: Self-pay | Admitting: Primary Care

## 2017-07-24 DIAGNOSIS — E119 Type 2 diabetes mellitus without complications: Secondary | ICD-10-CM

## 2017-07-29 ENCOUNTER — Other Ambulatory Visit (INDEPENDENT_AMBULATORY_CARE_PROVIDER_SITE_OTHER): Payer: 59

## 2017-07-29 DIAGNOSIS — E119 Type 2 diabetes mellitus without complications: Secondary | ICD-10-CM | POA: Diagnosis not present

## 2017-07-29 LAB — BASIC METABOLIC PANEL
BUN: 10 mg/dL (ref 6–23)
CO2: 24 mEq/L (ref 19–32)
CREATININE: 0.6 mg/dL (ref 0.40–1.20)
Calcium: 9.2 mg/dL (ref 8.4–10.5)
Chloride: 106 mEq/L (ref 96–112)
GFR: 137.43 mL/min (ref >60.00)
Glucose, Bld: 126 mg/dL — ABNORMAL HIGH (ref 70–99)
POTASSIUM: 3.5 meq/L (ref 3.5–5.1)
Sodium: 138 mEq/L (ref 135–145)

## 2017-07-29 LAB — MICROALBUMIN / CREATININE URINE RATIO
CREATININE, U: 240.7 mg/dL
MICROALB UR: 2.8 mg/dL — AB (ref 0.0–1.9)
Microalb Creat Ratio: 1.2 mg/g (ref 0.0–30.0)

## 2017-07-29 LAB — HEMOGLOBIN A1C: HEMOGLOBIN A1C: 7.3 % — AB (ref 4.6–6.5)

## 2017-07-31 NOTE — Progress Notes (Signed)
Pre visit review using our clinic review tool, if applicable. No additional management support is needed unless otherwise documented below in the visit note. 

## 2017-08-07 MED ORDER — METFORMIN HCL ER 500 MG PO TB24: 1000.0000 mg | ORAL_TABLET | Freq: Every day | ORAL | 1 refills | Status: DC

## 2017-08-14 NOTE — Telephone Encounter (Signed)
No need for prior auth. Patient pick up the medication at the time of when it was prescribed.

## 2017-08-18 ENCOUNTER — Encounter: Payer: Self-pay | Admitting: Primary Care

## 2017-08-18 DIAGNOSIS — E119 Type 2 diabetes mellitus without complications: Secondary | ICD-10-CM

## 2017-08-19 MED ORDER — GLIPIZIDE ER 5 MG PO TB24: 5.0000 mg | ORAL_TABLET | Freq: Every day | ORAL | 1 refills | Status: DC

## 2017-08-19 MED ORDER — METFORMIN HCL ER 500 MG PO TB24: 500.0000 mg | ORAL_TABLET | Freq: Every day | ORAL | 1 refills | Status: DC

## 2017-08-19 NOTE — Telephone Encounter (Signed)
Please cancel Glipizide and Metformin at [facility]. She prefers Educational psychologist.

## 2017-08-20 NOTE — Telephone Encounter (Signed)
Already called and cancel Rx on 08/19/2017

## 2017-09-29 ENCOUNTER — Ambulatory Visit (INDEPENDENT_AMBULATORY_CARE_PROVIDER_SITE_OTHER): Payer: 59 | Admitting: Primary Care

## 2017-09-29 ENCOUNTER — Encounter: Payer: Self-pay | Admitting: Primary Care

## 2017-09-29 VITALS — HR 81 | Temp 98.2°F | Ht 62.25 in | Wt 180.0 lb

## 2017-09-29 DIAGNOSIS — G8929 Other chronic pain: Secondary | ICD-10-CM | POA: Diagnosis not present

## 2017-09-29 DIAGNOSIS — Z23 Encounter for immunization: Secondary | ICD-10-CM

## 2017-09-29 DIAGNOSIS — Z0001 Encounter for general adult medical examination with abnormal findings: Secondary | ICD-10-CM | POA: Insufficient documentation

## 2017-09-29 DIAGNOSIS — E119 Type 2 diabetes mellitus without complications: Secondary | ICD-10-CM | POA: Diagnosis not present

## 2017-09-29 DIAGNOSIS — Z0000 Encounter for general adult medical examination without abnormal findings: Secondary | ICD-10-CM | POA: Diagnosis not present

## 2017-09-29 DIAGNOSIS — Z1239 Encounter for other screening for malignant neoplasm of breast: Secondary | ICD-10-CM

## 2017-09-29 DIAGNOSIS — M5442 Lumbago with sciatica, left side: Secondary | ICD-10-CM

## 2017-09-29 DIAGNOSIS — J309 Allergic rhinitis, unspecified: Secondary | ICD-10-CM

## 2017-09-29 DIAGNOSIS — Z1231 Encounter for screening mammogram for malignant neoplasm of breast: Secondary | ICD-10-CM

## 2017-09-29 DIAGNOSIS — G4700 Insomnia, unspecified: Secondary | ICD-10-CM | POA: Diagnosis not present

## 2017-09-29 MED ORDER — METFORMIN HCL ER 500 MG PO TB24: 500.0000 mg | ORAL_TABLET | Freq: Every day | ORAL | 3 refills | Status: DC

## 2017-09-29 MED ORDER — DICLOFENAC SODIUM 75 MG PO TBEC: 75.0000 mg | DELAYED_RELEASE_TABLET | Freq: Two times a day (BID) | ORAL | 1 refills | Status: DC | PRN

## 2017-09-29 MED ORDER — MIRTAZAPINE 15 MG PO TABS: 15.0000 mg | ORAL_TABLET | Freq: Every day | ORAL | 0 refills | Status: DC

## 2017-09-29 MED ORDER — FEXOFENADINE HCL 60 MG PO TABS: 60.0000 mg | ORAL_TABLET | Freq: Two times a day (BID) | ORAL | 1 refills | Status: DC

## 2017-09-29 MED ORDER — FREESTYLE LIBRE 14 DAY READER DEVI: 1.0000 | Freq: Three times a day (TID) | 0 refills | Status: DC

## 2017-09-29 MED ORDER — FREESTYLE LIBRE SENSOR SYSTEM MISC: 0 refills | Status: DC

## 2017-09-29 MED ORDER — GLIPIZIDE ER 5 MG PO TB24: 5.0000 mg | ORAL_TABLET | Freq: Every day | ORAL | 0 refills | Status: DC

## 2017-09-29 NOTE — Progress Notes (Signed)
Subjective:    Patient ID: Madison Herrera, female    DOB: [birth year], 46 y.o.   MRN: [id number]  HPI  [name] is a 46 year old female who presents today for complete physical.  Immunizations: -Tetanus: Unsure, thinks it's been over 10 years. -Influenza: Completed in October 2018.   Diet: She is working on changes in her diet. Breakfast: Protein shake, biscuit Lunch: Sandwich, salad Dinner: Sometimes skips, chicken, salad Snacks: Nuts, sometimes chips, fruit Desserts: Occasionally  Beverages: Sweet tea, water, occasional juice  Exercise: She is not currently exercising.  Eye exam: Due, she will schedule. Dental exam: Due next month. Pap Smear: Due, she declines today as she's on her menstrual cycle. Mammogram: Due. Order placed.   Review of Systems  Constitutional: Negative for unexpected weight change.  HENT: Negative for rhinorrhea.   Respiratory: Negative for cough and shortness of breath.   Cardiovascular: Negative for chest pain.  Gastrointestinal: Negative for constipation and diarrhea.  Endocrine: Positive for polyuria.  Genitourinary: Negative for difficulty urinating and menstrual problem.  Musculoskeletal: Positive for back pain. Negative for arthralgias and myalgias.       Chronic back pain  Skin: Negative for rash.  Allergic/Immunologic: Negative for environmental allergies.  Neurological: Positive for numbness. Negative for dizziness and headaches.  Psychiatric/Behavioral:       Overall stable, has good days and bad.        Past Medical History:  Diagnosis Date  . Allergic rhinitis   . Allergy   . Neuromuscular disorder ([place])    hand and feet -  tx with neurotin  . Neuropathy   . Type 2 diabetes mellitus ([place])      Social History   Socioeconomic History  . Marital status: Divorced    Spouse name: Not on file  . Number of children: Not on file  . Years of education: Not on file  . Highest education level: Not on file  Social Needs  .  Financial resource strain: Not on file  . Food insecurity - worry: Not on file  . Food insecurity - inability: Not on file  . Transportation needs - medical: Not on file  . Transportation needs - non-medical: Not on file  Occupational History  . Not on file  Tobacco Use  . Smoking status: Former Smoker    Packs/day: 0.25    Years: 0.50    Pack years: 0.12    Types: Cigarettes  . Smokeless tobacco: Never Used  Substance and Sexual Activity  . Alcohol use: Yes    Comment: occ  . Drug use: No  . Sexual activity: Not Currently    Partners: Male    Birth control/protection: Surgical  Other Topics Concern  . Not on file  Social History Narrative   Divorced.   2 daughters, 1 son.   Self employed.   Enjoys spending time at [employer].     Past Surgical History:  Procedure Laterality Date  . BTL    . CESAREAN SECTION     x 2  . TONSILLECTOMY AND ADENOIDECTOMY     age 20    Family History  Problem Relation Age of Onset  . Cancer - Lung Mother   . Diabetes Mother   . Diabetes Father   . Hypertension Father   . Cancer - Colon Brother   . Stomach cancer Brother   . Uterine cancer Paternal Aunt   . Healthy Brother   . Healthy Sister  x2  . Heart disease Neg Hx     Allergies  Allergen Reactions  . Latex Rash    fever  . Penicillins Rash  . Trazodone And Nefazodone Other (See Comments)    Vivid dreams/nightmares    Current Outpatient Medications on File Prior to Visit  Medication Sig Dispense Refill  . Blood Glucose Monitoring Suppl (ONE TOUCH ULTRA 2) w/Device KIT Use as instructed to test blood sugar 2 times daily 1 each 0  . cyclobenzaprine (FLEXERIL) 5 MG tablet Take 5 mg by mouth 2 (two) times daily as needed for muscle spasms.    . fluticasone (FLONASE) 50 MCG/ACT nasal spray Place 2 sprays into both nostrils daily. 16 g 1  . glucose blood (ONE TOUCH ULTRA TEST) test strip Use as instructed to test blood sugar 2 times daily 100 each 5  . Lancets  (ONETOUCH ULTRASOFT) lancets Use as instructed to test blood sugar 2 times daily 100 each 5  . naproxen (NAPROSYN) 500 MG tablet Take 1 tablet by mouth twice daily with food as needed for pain. 30 tablet 0   No current facility-administered medications on file prior to visit.     Pulse 81   Temp 98.2 F (36.8 C) (Oral)   Ht 5' 2.25" (1.581 m)   Wt 180 lb (81.6 kg)   SpO2 99%   BMI 32.66 kg/m    Objective:   Physical Exam  Constitutional: She is oriented to person, place, and time. She appears well-nourished.  HENT:  Right Ear: Tympanic membrane and ear canal normal.  Left Ear: Tympanic membrane and ear canal normal.  Nose: Nose normal.  Mouth/Throat: Oropharynx is clear and moist.  Eyes: Conjunctivae and EOM are normal. Pupils are equal, round, and reactive to light.  Neck: Neck supple. No thyromegaly present.  Cardiovascular: Normal rate and regular rhythm.  No murmur heard. Pulmonary/Chest: Effort normal and breath sounds normal. She has no rales.  Abdominal: Soft. Bowel sounds are normal. There is no tenderness.  Musculoskeletal: Normal range of motion.  Lymphadenopathy:    She has no cervical adenopathy.  Neurological: She is alert and oriented to person, place, and time. She has normal reflexes. No cranial nerve deficit.  Skin: Skin is warm and dry. No rash noted.  Psychiatric: She has a normal mood and affect.          Assessment & Plan:

## 2017-09-29 NOTE — Assessment & Plan Note (Signed)
Td due, provided today, influenza vaccination UTD. Pap smear due, she declines as she's on her menstrual cycle and will reschedule to have this done. Mammogram due, pending. Discussed the importance of a healthy diet and regular exercise in order for weight loss, and to reduce the risk of other medical problems. Exam unremarkable. Labs pending. Follow up in 1 year.

## 2017-09-29 NOTE — Patient Instructions (Addendum)
Call the [facility] in [place] to schedule your mammogram.  You were provided with a tetanus vaccination today, this will cover you for 10 years.  We've increased the dose of your mirtazapine from 7.5 mg to 15 mg. Take 1 tablet by mouth at bedtime.  Continue Metformin and glipizide daily.  Schedule your diabetic eye exam.  Schedule a lab only appointment for mid December to recheck your A1C.  Schedule a follow up visit in 6 months.  It was a pleasure to see you today!

## 2017-09-29 NOTE — Assessment & Plan Note (Signed)
Currently managed on mirtazapine 7.5 mg, no improvement. Will increase to 15 mg once nightly. She will update in several weeks.

## 2017-09-29 NOTE — Assessment & Plan Note (Signed)
Chronic for years. Declines PT. Using Naproxen BID most days of the week with slight improvement. Will trial Diclofenac and have her do home exercises.

## 2017-09-29 NOTE — Assessment & Plan Note (Signed)
Due for repeat A1C in mid December, last A1C of 7.3. Could not tolerate increased dose of Metformin, doing well on Metformin and Glipizide, continue same for now.

## 2017-09-30 ENCOUNTER — Other Ambulatory Visit: Payer: Self-pay | Admitting: Primary Care

## 2017-09-30 ENCOUNTER — Encounter: Payer: Self-pay | Admitting: Primary Care

## 2017-09-30 DIAGNOSIS — E119 Type 2 diabetes mellitus without complications: Secondary | ICD-10-CM

## 2017-10-01 ENCOUNTER — Telehealth: Payer: Self-pay | Admitting: Primary Care

## 2017-10-01 NOTE — Telephone Encounter (Signed)
Will you please call pharmacy and correct? Thanks.

## 2017-10-01 NOTE — Telephone Encounter (Signed)
Patient needs supplies for [facility] - disc called in. Will need 2 disc per month sent to pharmacy.

## 2017-10-01 NOTE — Telephone Encounter (Signed)
Copied from [place]. Topic: Quick Communication - See Telephone Encounter >> Oct 01, 2017 11:01 AM Arletha Grippe wrote: CRM for notification. See Telephone encounter for:   10/01/17. [facility] called - they are asking if it ok to dispense 2 freestyle libre sensors? So the patient doesn't have to come back to pharmacy in 2 weeks?  Cb number is [phone]

## 2017-10-02 ENCOUNTER — Other Ambulatory Visit: Payer: Self-pay | Admitting: Primary Care

## 2017-10-02 DIAGNOSIS — E119 Type 2 diabetes mellitus without complications: Secondary | ICD-10-CM

## 2017-10-02 NOTE — Telephone Encounter (Signed)
The correction was already done.

## 2017-10-28 ENCOUNTER — Other Ambulatory Visit: Payer: 59

## 2017-11-03 ENCOUNTER — Ambulatory Visit: Payer: 59 | Admitting: Primary Care

## 2017-11-03 ENCOUNTER — Other Ambulatory Visit (INDEPENDENT_AMBULATORY_CARE_PROVIDER_SITE_OTHER): Payer: 59

## 2017-11-03 DIAGNOSIS — E119 Type 2 diabetes mellitus without complications: Secondary | ICD-10-CM | POA: Diagnosis not present

## 2017-11-03 LAB — HEMOGLOBIN A1C: Hgb A1c MFr Bld: 4.7 % (ref 4.6–6.5)

## 2017-11-04 ENCOUNTER — Other Ambulatory Visit (HOSPITAL_COMMUNITY)
Admission: RE | Admit: 2017-11-04 | Discharge: 2017-11-04 | Disposition: A | Discharge status: Home / Self Care | Payer: 59 | Source: Ambulatory Visit | Attending: Primary Care | Admitting: Primary Care

## 2017-11-04 ENCOUNTER — Encounter: Payer: Self-pay | Admitting: Primary Care

## 2017-11-04 ENCOUNTER — Ambulatory Visit (INDEPENDENT_AMBULATORY_CARE_PROVIDER_SITE_OTHER): Payer: 59 | Admitting: Primary Care

## 2017-11-04 VITALS — BP 124/80 | HR 78 | Temp 98.1°F | Ht 62.5 in | Wt 178.8 lb

## 2017-11-04 DIAGNOSIS — E119 Type 2 diabetes mellitus without complications: Secondary | ICD-10-CM | POA: Diagnosis not present

## 2017-11-04 DIAGNOSIS — Z124 Encounter for screening for malignant neoplasm of cervix: Secondary | ICD-10-CM | POA: Insufficient documentation

## 2017-11-04 MED ORDER — FREESTYLE LIBRE 14 DAY SENSOR MISC: 1.0000 "application " | 3 refills | Status: DC

## 2017-11-04 NOTE — Progress Notes (Signed)
Subjective:    Patient ID: Madison Herrera, female    DOB: [birth year], 46 y.o.   MRN: [id number]  HPI  Madison Herrera is a 46 year old female who presents today for Pap Smear. She completed her physical in mid November 2018.  She denies vaginal discharge, vaginal itching, rashes.   Review of Systems  Genitourinary: Negative for dysuria, frequency, pelvic pain and vaginal discharge.  Skin: Negative for color change.       Past Medical History:  Diagnosis Date  . Allergic rhinitis   . Allergy   . Neuromuscular disorder ([place])    hand and feet -  tx with neurotin  . Neuropathy   . Type 2 diabetes mellitus ([place])      Social History   Socioeconomic History  . Marital status: Divorced    Spouse name: Not on file  . Number of children: Not on file  . Years of education: Not on file  . Highest education level: Not on file  Social Needs  . Financial resource strain: Not on file  . Food insecurity - worry: Not on file  . Food insecurity - inability: Not on file  . Transportation needs - medical: Not on file  . Transportation needs - non-medical: Not on file  Occupational History  . Not on file  Tobacco Use  . Smoking status: Former Smoker    Packs/day: 0.25    Years: 0.50    Pack years: 0.12    Types: Cigarettes  . Smokeless tobacco: Never Used  Substance and Sexual Activity  . Alcohol use: Yes    Comment: occ  . Drug use: No  . Sexual activity: Not Currently    Partners: Male    Birth control/protection: Surgical  Other Topics Concern  . Not on file  Social History Narrative   Divorced.   2 daughters, 1 son.   Self employed.   Enjoys spending time at [employer].     Past Surgical History:  Procedure Laterality Date  . BTL    . CESAREAN SECTION     x 2  . DILITATION & CURRETTAGE/HYSTROSCOPY WITH HYDROTHERMAL ABLATION N/A 08/19/2013   Procedure: DILATATION & CURETTAGE/HYSTEROSCOPY WITH HYDROTHERMAL ABLATION;  Surgeon: Shelly Bombard, MD;  Location: [facility];   Service: Gynecology;  Laterality: N/A;  . TONSILLECTOMY AND ADENOIDECTOMY     age 57    Family History  Problem Relation Age of Onset  . Cancer - Lung Mother   . Diabetes Mother   . Diabetes Father   . Hypertension Father   . Cancer - Colon Brother   . Stomach cancer Brother   . Uterine cancer Paternal Aunt   . Healthy Brother   . Healthy Sister        x2  . Heart disease Neg Hx     Allergies  Allergen Reactions  . Latex Rash    fever  . Penicillins Rash  . Trazodone And Nefazodone Other (See Comments)    Vivid dreams/nightmares    Current Outpatient Medications on File Prior to Visit  Medication Sig Dispense Refill  . Blood Glucose Monitoring Suppl (ONE TOUCH ULTRA 2) w/Device KIT Use as instructed to test blood sugar 2 times daily 1 each 0  . Continuous Blood Gluc Receiver (FREESTYLE LIBRE 14 DAY READER) DEVI 1 Device 3 (three) times daily by Does not apply route. 1 Device 0  . Continuous Blood Gluc Sensor (FREESTYLE LIBRE 14 DAY SENSOR) MISC USE AS DIRECTED WITH RECEIVER  1 each 6  . cyclobenzaprine (FLEXERIL) 5 MG tablet Take 5 mg by mouth 2 (two) times daily as needed for muscle spasms.    . diclofenac (VOLTAREN) 75 MG EC tablet Take 1 tablet (75 mg total) 2 (two) times daily as needed by mouth for moderate pain. 60 tablet 1  . fexofenadine (ALLEGRA) 60 MG tablet Take 1 tablet (60 mg total) 2 (two) times daily by mouth. 180 tablet 1  . fluticasone (FLONASE) 50 MCG/ACT nasal spray Place 2 sprays into both nostrils daily. 16 g 1  . glipiZIDE (GLUCOTROL XL) 5 MG 24 hr tablet Take 1 tablet (5 mg total) daily with breakfast by mouth. 90 tablet 0  . glucose blood (ONE TOUCH ULTRA TEST) test strip Use as instructed to test blood sugar 2 times daily 100 each 5  . Lancets (ONETOUCH ULTRASOFT) lancets Use as instructed to test blood sugar 2 times daily 100 each 5  . metFORMIN (GLUCOPHAGE XR) 500 MG 24 hr tablet Take 1 tablet (500 mg total) daily with breakfast by mouth. 90 tablet 3   . mirtazapine (REMERON) 15 MG tablet Take 1 tablet (15 mg total) at bedtime by mouth. 90 tablet 0   No current facility-administered medications on file prior to visit.     BP 124/80   Pulse 78   Temp 98.1 F (36.7 C) (Oral)   Ht 5' 2.5" (1.588 m)   Wt 178 lb 12.8 oz (81.1 kg)   BMI 32.18 kg/m    Objective:   Physical Exam  Neck: Neck supple.  Cardiovascular: Normal rate and regular rhythm.  Pulmonary/Chest: Effort normal and breath sounds normal.  Genitourinary: There is no tenderness or lesion on the right labia. There is no tenderness or lesion on the left labia. Cervix exhibits no motion tenderness and no discharge. Right adnexum displays no tenderness. Left adnexum displays no tenderness. No vaginal discharge found.  Skin: Skin is warm and dry.          Assessment & Plan:  Pap Smear:  Completed today and pending. Exam unremarkable.  Sheral Flow, NP

## 2017-11-04 NOTE — Addendum Note (Signed)
Addended by: Jacqualin Combes on: 11/04/2017 12:32 PM   Modules accepted: Orders

## 2017-11-04 NOTE — Patient Instructions (Signed)
We will notify you of your Pap results once received.  Stop Glipizide XL 5 mg for now. Continue Metformin XR 500 mg once daily as prescribed.  Schedule a lab only appointment 3 months to recheck A1C.  It was a pleasure to see you today!

## 2017-11-04 NOTE — Assessment & Plan Note (Signed)
Recent A1C of 4.7 from 7.3 three months prior. She continues to work on diet changes. She denies feeling jittery, dizzy, shaky. Will hold Glipizide XL 5 mg, continue Metformin XR 500 mg. Recheck A1C in 3 months.

## 2017-11-09 LAB — CYTOLOGY - PAP
Diagnosis: NEGATIVE
HPV 16/18/45 genotyping: NEGATIVE
HPV: DETECTED — AB

## 2017-12-24 ENCOUNTER — Other Ambulatory Visit: Payer: Self-pay | Admitting: Primary Care

## 2017-12-24 DIAGNOSIS — G4700 Insomnia, unspecified: Secondary | ICD-10-CM

## 2018-01-20 ENCOUNTER — Encounter: Payer: Self-pay | Admitting: Primary Care

## 2018-01-22 ENCOUNTER — Encounter: Payer: Self-pay | Admitting: Primary Care

## 2018-01-22 ENCOUNTER — Ambulatory Visit (INDEPENDENT_AMBULATORY_CARE_PROVIDER_SITE_OTHER): Payer: No Typology Code available for payment source | Admitting: Primary Care

## 2018-01-22 VITALS — BP 120/70 | HR 67 | Temp 98.1°F | Ht 62.25 in | Wt 177.5 lb

## 2018-01-22 DIAGNOSIS — E119 Type 2 diabetes mellitus without complications: Secondary | ICD-10-CM | POA: Diagnosis not present

## 2018-01-22 DIAGNOSIS — M5442 Lumbago with sciatica, left side: Secondary | ICD-10-CM | POA: Diagnosis not present

## 2018-01-22 DIAGNOSIS — G8929 Other chronic pain: Secondary | ICD-10-CM | POA: Diagnosis not present

## 2018-01-22 MED ORDER — FREESTYLE LIBRE 14 DAY SENSOR MISC: 1.0000 "application " | 11 refills | Status: DC

## 2018-01-22 MED ORDER — CYCLOBENZAPRINE HCL 5 MG PO TABS: 5.0000 mg | ORAL_TABLET | Freq: Two times a day (BID) | ORAL | 0 refills | Status: DC | PRN

## 2018-01-22 MED ORDER — PREDNISONE 10 MG PO TABS: ORAL_TABLET | ORAL | 0 refills | Status: DC

## 2018-01-22 MED ORDER — MELOXICAM 15 MG PO TABS: ORAL_TABLET | ORAL | 0 refills | Status: DC

## 2018-01-22 NOTE — Assessment & Plan Note (Signed)
Acute on chronic flare. Discussed PT for which she still declines. Home exercises provided today. Rx for prednisone course sent to pharmacy. Switch from diclofenac to Meloxicam, hold until after prednisone course complete. Use Flexeril PRN.   Consider MRI vs neurosurgery eval flares become more recurrent.

## 2018-01-22 NOTE — Patient Instructions (Signed)
Start prednisone tablets. Take three tablets for 3 days, then two tablets for 3 days, then one tablet for 3 days.  You may take Meloxicam once daily as needed for pain. Do not start this until you complete your prednisone.   Continue Flexeril as needed for muscle spasms.   Please notify me if no improvement and/or if you start to experience recurrent flares.   It was a pleasure to see you today!   Back Exercises The following exercises strengthen the muscles that help to support the back. They also help to keep the lower back flexible. Doing these exercises can help to prevent back pain or lessen existing pain. If you have back pain or discomfort, try doing these exercises 2-3 times each day or as told by your health care provider. When the pain goes away, do them once each day, but increase the number of times that you repeat the steps for each exercise (do more repetitions). If you do not have back pain or discomfort, do these exercises once each day or as told by your health care provider. Exercises Single Knee to Chest  Repeat these steps 3-5 times for each leg: 1. Lie on your back on a firm bed or the floor with your legs extended. 2. Bring one knee to your chest. Your other leg should stay extended and in contact with the floor. 3. Hold your knee in place by grabbing your knee or thigh. 4. Pull on your knee until you feel a gentle stretch in your lower back. 5. Hold the stretch for 10-30 seconds. 6. Slowly release and straighten your leg.  Pelvic Tilt  Repeat these steps 5-10 times: 1. Lie on your back on a firm bed or the floor with your legs extended. 2. Bend your knees so they are pointing toward the ceiling and your feet are flat on the floor. 3. Tighten your lower abdominal muscles to press your lower back against the floor. This motion will tilt your pelvis so your tailbone points up toward the ceiling instead of pointing to your feet or the floor. 4. With gentle tension  and even breathing, hold this position for 5-10 seconds.  Cat-Cow  Repeat these steps until your lower back becomes more flexible: 1. Get into a hands-and-knees position on a firm surface. Keep your hands under your shoulders, and keep your knees under your hips. You may place padding under your knees for comfort. 2. Let your head hang down, and point your tailbone toward the floor so your lower back becomes rounded like the back of a cat. 3. Hold this position for 5 seconds. 4. Slowly lift your head and point your tailbone up toward the ceiling so your back forms a sagging arch like the back of a cow. 5. Hold this position for 5 seconds.  Press-Ups  Repeat these steps 5-10 times: 1. Lie on your abdomen (face-down) on the floor. 2. Place your palms near your head, about shoulder-width apart. 3. While you keep your back as relaxed as possible and keep your hips on the floor, slowly straighten your arms to raise the top half of your body and lift your shoulders. Do not use your back muscles to raise your upper torso. You may adjust the placement of your hands to make yourself more comfortable. 4. Hold this position for 5 seconds while you keep your back relaxed. 5. Slowly return to lying flat on the floor.  Bridges  Repeat these steps 10 times: 1. Lie on your  back on a firm surface. 2. Bend your knees so they are pointing toward the ceiling and your feet are flat on the floor. 3. Tighten your buttocks muscles and lift your buttocks off of the floor until your waist is at almost the same height as your knees. You should feel the muscles working in your buttocks and the back of your thighs. If you do not feel these muscles, slide your feet 1-2 inches farther away from your buttocks. 4. Hold this position for 3-5 seconds. 5. Slowly lower your hips to the starting position, and allow your buttocks muscles to relax completely.  If this exercise is too easy, try doing it with your arms crossed  over your chest. Abdominal Crunches  Repeat these steps 5-10 times: 1. Lie on your back on a firm bed or the floor with your legs extended. 2. Bend your knees so they are pointing toward the ceiling and your feet are flat on the floor. 3. Cross your arms over your chest. 4. Tip your chin slightly toward your chest without bending your neck. 5. Tighten your abdominal muscles and slowly raise your trunk (torso) high enough to lift your shoulder blades a tiny bit off of the floor. Avoid raising your torso higher than that, because it can put too much stress on your low back and it does not help to strengthen your abdominal muscles. 6. Slowly return to your starting position.  Back Lifts Repeat these steps 5-10 times: 1. Lie on your abdomen (face-down) with your arms at your sides, and rest your forehead on the floor. 2. Tighten the muscles in your legs and your buttocks. 3. Slowly lift your chest off of the floor while you keep your hips pressed to the floor. Keep the back of your head in line with the curve in your back. Your eyes should be looking at the floor. 4. Hold this position for 3-5 seconds. 5. Slowly return to your starting position.  Contact a health care provider if:  Your back pain or discomfort gets much worse when you do an exercise.  Your back pain or discomfort does not lessen within 2 hours after you exercise. If you have any of these problems, stop doing these exercises right away. Do not do them again unless your health care provider says that you can. Get help right away if:  You develop sudden, severe back pain. If this happens, stop doing the exercises right away. Do not do them again unless your health care provider says that you can. This information is not intended to replace advice given to you by your health care provider. Make sure you discuss any questions you have with your health care provider. Document Released: 12/11/2004 Document Revised: 03/12/2016  Document Reviewed: 12/28/2014 Elsevier Interactive Patient Education  2017 [employer].

## 2018-01-22 NOTE — Progress Notes (Signed)
Subjective:    Patient ID: Madison Herrera, female    DOB: 14-Jun-1971, 47 y.o.   MRN: [id number]  HPI  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   Her pain is located to the bilateral lower back with radiation of pain to her bilateral lower extremities. She has a history of these flares with her last flare being several months ago. Her recent pain started 3-4 days ago. It will take her a few minutes to walk once getting up from a sitting position. She's been taking diclofenac and cyclobenzaprine without much improvement. She denies loss of bowel/bladder control, weakness.   Review of Systems  Respiratory: Negative for shortness of breath.   Cardiovascular: Negative for chest pain.  Musculoskeletal: Positive for back pain.  Neurological: Negative for weakness and numbness.       Past Medical History:  Diagnosis Date  . Allergic rhinitis   . Allergy   . Neuromuscular disorder ([place])    hand and feet -  tx with neurotin  . Neuropathy   . Type 2 diabetes mellitus ([place])      Social History   Socioeconomic History  . Marital status: Divorced    Spouse name: Not on file  . Number of children: Not on file  . Years of education: Not on file  . Highest education level: Not on file  Social Needs  . Financial resource strain: Not on file  . Food insecurity - worry: Not on file  . Food insecurity - inability: Not on file  . Transportation needs - medical: Not on file  . Transportation needs - non-medical: Not on file  Occupational History  . Not on file  Tobacco Use  . Smoking status: Former Smoker    Packs/day: 0.25    Years: 0.50    Pack years: 0.12    Types: Cigarettes  . Smokeless tobacco: Never Used  Substance and Sexual Activity  . Alcohol use: Yes    Comment: occ  . Drug use: No  . Sexual activity: Not Currently    Partners: Male    Birth  control/protection: Surgical  Other Topics Concern  . Not on file  Social History Narrative   Divorced.   2 daughters, 1 son.   Self employed.   Enjoys spending time at [employer].     Past Surgical History:  Procedure Laterality Date  . BTL    . CESAREAN SECTION     x 2  . DILITATION & CURRETTAGE/HYSTROSCOPY WITH HYDROTHERMAL ABLATION N/A 08/19/2013   Procedure: DILATATION & CURETTAGE/HYSTEROSCOPY WITH HYDROTHERMAL ABLATION;  Surgeon: Shelly Bombard, MD;  Location: [facility];  Service: Gynecology;  Laterality: N/A;  . TONSILLECTOMY AND ADENOIDECTOMY     age 13    Family History  Problem Relation Age of Onset  . Cancer - Lung Mother   . Diabetes Mother   . Diabetes Father   . Hypertension Father   . Cancer - Colon Brother   . Stomach cancer Brother   . Uterine cancer Paternal Aunt   . Healthy Brother   . Healthy Sister        x2  . Heart disease Neg Hx     Allergies  Allergen Reactions  . Latex Rash    fever  . Penicillins Rash  . Trazodone And Nefazodone Other (See Comments)    Vivid dreams/nightmares  Current Outpatient Medications on File Prior to Visit  Medication Sig Dispense Refill  . Blood Glucose Monitoring Suppl (ONE TOUCH ULTRA 2) w/Device KIT Use as instructed to test blood sugar 2 times daily 1 each 0  . Continuous Blood Gluc Receiver (FREESTYLE LIBRE 14 DAY READER) DEVI 1 Device 3 (three) times daily by Does not apply route. 1 Device 0  . fexofenadine (ALLEGRA) 60 MG tablet Take 1 tablet (60 mg total) 2 (two) times daily by mouth. 180 tablet 1  . fluticasone (FLONASE) 50 MCG/ACT nasal spray Place 2 sprays into both nostrils daily. 16 g 1  . glipiZIDE (GLUCOTROL XL) 5 MG 24 hr tablet Take 1 tablet (5 mg total) daily with breakfast by mouth. 90 tablet 0  . glucose blood (ONE TOUCH ULTRA TEST) test strip Use as instructed to test blood sugar 2 times daily 100 each 5  . Lancets (ONETOUCH ULTRASOFT) lancets Use as instructed to test blood sugar 2 times  daily 100 each 5  . metFORMIN (GLUCOPHAGE XR) 500 MG 24 hr tablet Take 1 tablet (500 mg total) daily with breakfast by mouth. 90 tablet 3  . mirtazapine (REMERON) 15 MG tablet TAKE 1 TABLET(15 MG) BY MOUTH AT BEDTIME 90 tablet 1   No current facility-administered medications on file prior to visit.     BP 120/70   Pulse 67   Temp 98.1 F (36.7 C) (Oral)   Ht 5' 2.25" (1.581 m)   Wt 177 lb 8 oz (80.5 kg)   SpO2 98%   BMI 32.20 kg/m    Objective:   Physical Exam  Constitutional: She appears well-nourished.  Cardiovascular: Normal rate and regular rhythm.  Pulmonary/Chest: Effort normal and breath sounds normal.  Musculoskeletal:       Lumbar back: She exhibits pain. She exhibits normal range of motion and no tenderness.  5/5 strength to bilateral lower extremities. Negative straight leg raise.  Skin: Skin is warm and dry.          Assessment & Plan:

## 2018-02-02 ENCOUNTER — Other Ambulatory Visit (INDEPENDENT_AMBULATORY_CARE_PROVIDER_SITE_OTHER): Payer: No Typology Code available for payment source

## 2018-02-02 ENCOUNTER — Other Ambulatory Visit: Payer: 59

## 2018-02-02 DIAGNOSIS — E119 Type 2 diabetes mellitus without complications: Secondary | ICD-10-CM

## 2018-02-02 LAB — HEMOGLOBIN A1C: HEMOGLOBIN A1C: 5.5 % (ref 4.6–6.5)

## 2018-02-20 ENCOUNTER — Other Ambulatory Visit: Payer: Self-pay | Admitting: Primary Care

## 2018-02-20 DIAGNOSIS — M5442 Lumbago with sciatica, left side: Principal | ICD-10-CM

## 2018-02-20 DIAGNOSIS — G8929 Other chronic pain: Secondary | ICD-10-CM

## 2018-02-22 DIAGNOSIS — G8929 Other chronic pain: Secondary | ICD-10-CM

## 2018-02-22 DIAGNOSIS — M5442 Lumbago with sciatica, left side: Principal | ICD-10-CM

## 2018-02-22 MED ORDER — MELOXICAM 15 MG PO TABS: ORAL_TABLET | ORAL | 0 refills | Status: DC

## 2018-02-22 NOTE — Telephone Encounter (Signed)
Noted, will send my chart message with some follow up questions.

## 2018-02-22 NOTE — Telephone Encounter (Signed)
Ok to refill? Electronically refill request for meloxicam (MOBIC) 15 MG tablet  Last prescribed and seen on 01/22/2018.

## 2018-03-30 ENCOUNTER — Ambulatory Visit: Payer: 59 | Admitting: Primary Care

## 2018-03-30 DIAGNOSIS — Z0289 Encounter for other administrative examinations: Secondary | ICD-10-CM

## 2018-04-13 ENCOUNTER — Other Ambulatory Visit: Payer: Self-pay | Admitting: Primary Care

## 2018-04-13 DIAGNOSIS — M5442 Lumbago with sciatica, left side: Principal | ICD-10-CM

## 2018-04-13 DIAGNOSIS — G8929 Other chronic pain: Secondary | ICD-10-CM

## 2018-04-14 NOTE — Telephone Encounter (Signed)
Based off of our conversation via my chart in April she is taking at least once weekly. Refill sent to pharmacy.

## 2018-04-14 NOTE — Telephone Encounter (Signed)
Ok to refill? Electronically refill request for meloxicam (MOBIC) 15 MG tablet  Last prescribed on 02/22/2018. Last seen on 01/22/2018

## 2018-05-07 ENCOUNTER — Other Ambulatory Visit: Payer: Self-pay | Admitting: Primary Care

## 2018-05-07 ENCOUNTER — Encounter: Payer: Self-pay | Admitting: Primary Care

## 2018-05-07 DIAGNOSIS — E119 Type 2 diabetes mellitus without complications: Secondary | ICD-10-CM

## 2018-05-10 MED ORDER — FREESTYLE LIBRE 14 DAY READER DEVI: 1.0000 | Freq: Three times a day (TID) | 0 refills | Status: DC

## 2018-06-02 ENCOUNTER — Ambulatory Visit: Payer: No Typology Code available for payment source | Admitting: Primary Care

## 2018-06-02 DIAGNOSIS — Z0289 Encounter for other administrative examinations: Secondary | ICD-10-CM

## 2018-06-10 ENCOUNTER — Ambulatory Visit (INDEPENDENT_AMBULATORY_CARE_PROVIDER_SITE_OTHER): Payer: No Typology Code available for payment source | Admitting: Primary Care

## 2018-06-10 ENCOUNTER — Encounter: Payer: Self-pay | Admitting: Primary Care

## 2018-06-10 VITALS — BP 122/72 | HR 66 | Temp 98.5°F | Wt 178.0 lb

## 2018-06-10 DIAGNOSIS — M5440 Lumbago with sciatica, unspecified side: Secondary | ICD-10-CM

## 2018-06-10 DIAGNOSIS — E119 Type 2 diabetes mellitus without complications: Secondary | ICD-10-CM | POA: Diagnosis not present

## 2018-06-10 DIAGNOSIS — G8929 Other chronic pain: Secondary | ICD-10-CM | POA: Diagnosis not present

## 2018-06-10 LAB — LIPID PANEL
CHOL/HDL RATIO: 4
Cholesterol: 177 mg/dL (ref 0–200)
HDL: 44.2 mg/dL (ref >39.00)
LDL CALC: 118 mg/dL — AB (ref 0–99)
NonHDL: 132.97
TRIGLYCERIDES: 74 mg/dL (ref 0.0–149.0)
VLDL: 14.8 mg/dL (ref 0.0–40.0)

## 2018-06-10 LAB — COMPREHENSIVE METABOLIC PANEL
ALBUMIN: 4.5 g/dL (ref 3.5–5.2)
ALK PHOS: 47 U/L (ref 39–117)
ALT: 7 U/L (ref 0–35)
AST: 11 U/L (ref 0–37)
BUN: 14 mg/dL (ref 6–23)
CALCIUM: 9.5 mg/dL (ref 8.4–10.5)
CHLORIDE: 106 meq/L (ref 96–112)
CO2: 27 mEq/L (ref 19–32)
Creatinine, Ser: 0.69 mg/dL (ref 0.40–1.20)
GFR: 116.53 mL/min (ref >60.00)
Glucose, Bld: 108 mg/dL — ABNORMAL HIGH (ref 70–99)
POTASSIUM: 3.7 meq/L (ref 3.5–5.1)
SODIUM: 139 meq/L (ref 135–145)
TOTAL PROTEIN: 7.8 g/dL (ref 6.0–8.3)
Total Bilirubin: 0.9 mg/dL (ref 0.2–1.2)

## 2018-06-10 LAB — MICROALBUMIN / CREATININE URINE RATIO
CREATININE, U: 213 mg/dL
MICROALB UR: 3.8 mg/dL — AB (ref 0.0–1.9)
MICROALB/CREAT RATIO: 1.8 mg/g (ref 0.0–30.0)

## 2018-06-10 LAB — HEMOGLOBIN A1C: Hgb A1c MFr Bld: 5.3 % (ref 4.6–6.5)

## 2018-06-10 NOTE — Patient Instructions (Addendum)
Stop by the lab prior to leaving today. I will notify you of your results once received.   You will be contacted regarding your referral to physical medicine for your back pain.  Please let us know if you have not been contacted within one week.   Please schedule a physical with me in 6 months. You may also schedule a lab only appointment 3-4 days prior. We will discuss your lab results in detail during your physical.  It was a pleasure to see you today!

## 2018-06-10 NOTE — Assessment & Plan Note (Signed)
Chronic, ongoing, with intermittent flares.  Discussed options for treatment including PT, ortho evaluation, physical medicine. She opts for physical medicine, referral placed.   Continue Meloxicam, can use Tylenol if needed.

## 2018-06-10 NOTE — Progress Notes (Signed)
Subjective:    Patient ID: Madison Herrera, female    DOB: [birth year], 47 y.o.   MRN: [id number]  HPI  Madison Herrera is a 47 year old female who presents today to discuss back pain and for diabetes follow up.  1) Chronic Back Pain: Last evaluated in March 2019 for acute on chronic flare. She was treated with prednisone course and switched to Meloxicam. She has a history of acute on chronic flares and is often treated with prednisone with improvement. She has declined physical therapy in the past.   Her pain is located to the bilateral lower back with radiation down her right lower extremity most recently. Over the last 3 weeks her symptoms have been worse. She denies loss of bowel or bladder control. She is taking Meloxicam with some improvement.   2) Type 2 Diabetes:  Current medications include: Metformin XR 500 mg.  She is checking his/her blood glucose 3-4 times daily and is getting readings of: AM fasting: 70-120 2 hours after lunch: 120-140 Bedtime: 110's  Last A1C: 5.5 in March 2019 Last Eye Exam: Completed in June  Last Foot Exam: Due today Pneumonia Vaccination: Due today ACE/ARB: Urine microalbumin due Statin: Lipids pending  Diet currently consists of:  Breakfast: Yogurt, crackers Lunch: Chicken, salad Dinner: Chicken, fish, salad Snacks: Nuts, fruit, vegetables  Desserts: Twice weekly  Beverages: Sweet tea, water  Exercise: She is not exercising.        Review of Systems  Eyes: Negative for visual disturbance.  Respiratory: Negative for shortness of breath.   Cardiovascular: Negative for chest pain.  Musculoskeletal: Positive for back pain.  Neurological: Positive for numbness. Negative for dizziness and headaches.       Past Medical History:  Diagnosis Date  . Allergic rhinitis   . Allergy   . Neuromuscular disorder ([place])    hand and feet -  tx with neurotin  . Neuropathy   . Type 2 diabetes mellitus ([place])      Social History   Socioeconomic  History  . Marital status: Divorced    Spouse name: Not on file  . Number of children: Not on file  . Years of education: Not on file  . Highest education level: Not on file  Occupational History  . Not on file  Social Needs  . Financial resource strain: Not on file  . Food insecurity:    Worry: Not on file    Inability: Not on file  . Transportation needs:    Medical: Not on file    Non-medical: Not on file  Tobacco Use  . Smoking status: Former Smoker    Packs/day: 0.25    Years: 0.50    Pack years: 0.12    Types: Cigarettes  . Smokeless tobacco: Never Used  Substance and Sexual Activity  . Alcohol use: Yes    Comment: occ  . Drug use: No  . Sexual activity: Not Currently    Partners: Male    Birth control/protection: Surgical  Lifestyle  . Physical activity:    Days per week: Not on file    Minutes per session: Not on file  . Stress: Not on file  Relationships  . Social connections:    Talks on phone: Not on file    Gets together: Not on file    Attends religious service: Not on file    Active member of club or organization: Not on file    Attends meetings of clubs or organizations: Not on file  Relationship status: Not on file  . Intimate partner violence:    Fear of current or ex partner: Not on file    Emotionally abused: Not on file    Physically abused: Not on file    Forced sexual activity: Not on file  Other Topics Concern  . Not on file  Social History Narrative   Divorced.   2 daughters, 1 son.   Self employed.   Enjoys spending time at [employer].     Past Surgical History:  Procedure Laterality Date  . BTL    . CESAREAN SECTION     x 2  . DILITATION & CURRETTAGE/HYSTROSCOPY WITH HYDROTHERMAL ABLATION N/A 08/19/2013   Procedure: DILATATION & CURETTAGE/HYSTEROSCOPY WITH HYDROTHERMAL ABLATION;  Surgeon: Shelly Bombard, MD;  Location: [facility];  Service: Gynecology;  Laterality: N/A;  . TONSILLECTOMY AND ADENOIDECTOMY     age 47    Family  History  Problem Relation Age of Onset  . Cancer - Lung Mother   . Diabetes Mother   . Diabetes Father   . Hypertension Father   . Cancer - Colon Brother   . Stomach cancer Brother   . Uterine cancer Paternal Aunt   . Healthy Brother   . Healthy Sister        x2  . Heart disease Neg Hx     Allergies  Allergen Reactions  . Latex Rash    fever  . Penicillins Rash  . Trazodone And Nefazodone Other (See Comments)    Vivid dreams/nightmares    Current Outpatient Medications on File Prior to Visit  Medication Sig Dispense Refill  . Blood Glucose Monitoring Suppl (ONE TOUCH ULTRA 2) w/Device KIT Use as instructed to test blood sugar 2 times daily 1 each 0  . Continuous Blood Gluc Receiver (FREESTYLE LIBRE 14 DAY READER) DEVI 1 Device by Does not apply route 3 (three) times daily. 1 Device 0  . Continuous Blood Gluc Sensor (FREESTYLE LIBRE 14 DAY SENSOR) MISC Place 1 application onto the skin every 14 (fourteen) days. USE AS DIRECTED WITH RECEIVER 2 each 11  . cyclobenzaprine (FLEXERIL) 5 MG tablet Take 1 tablet (5 mg total) by mouth 2 (two) times daily as needed for muscle spasms. 30 tablet 0  . fexofenadine (ALLEGRA) 60 MG tablet Take 1 tablet (60 mg total) 2 (two) times daily by mouth. 180 tablet 1  . fluticasone (FLONASE) 50 MCG/ACT nasal spray Place 2 sprays into both nostrils daily. 16 g 1  . glucose blood (ONE TOUCH ULTRA TEST) test strip Use as instructed to test blood sugar 2 times daily 100 each 5  . Lancets (ONETOUCH ULTRASOFT) lancets Use as instructed to test blood sugar 2 times daily 100 each 5  . meloxicam (MOBIC) 15 MG tablet TAKE 1 TABLET BY MOUTH ONCE DAILY AS NEEDED FOR PAIN 30 tablet 0  . metFORMIN (GLUCOPHAGE XR) 500 MG 24 hr tablet Take 1 tablet (500 mg total) daily with breakfast by mouth. 90 tablet 3  . mirtazapine (REMERON) 15 MG tablet TAKE 1 TABLET(15 MG) BY MOUTH AT BEDTIME 90 tablet 1   No current facility-administered medications on file prior to visit.      BP 122/72   Pulse 66   Temp 98.5 F (36.9 C) (Oral)   Wt 178 lb (80.7 kg)   SpO2 100%   BMI 32.30 kg/m    Objective:   Physical Exam  Constitutional: She appears well-nourished.  Neck: Neck supple.  Cardiovascular: Normal rate and  regular rhythm.  Respiratory: Effort normal and breath sounds normal.  Musculoskeletal:       Lumbar back: She exhibits pain. She exhibits normal range of motion, no tenderness and no bony tenderness.       Back:  5/5 strength to bilateral lower extremities  Skin: Skin is warm and dry.           Assessment & Plan:

## 2018-06-10 NOTE — Assessment & Plan Note (Signed)
Repeat A1C and urine microalbumin pending. Lipids pending. Continue Metformin XR 500 mg for now.  Glucose readings seem to be well controlled. Foot exam today.  Pneumonia vaccination next visit. Discussed diet and exercise. Follow up in 6 months.

## 2018-06-21 ENCOUNTER — Encounter: Payer: Self-pay | Admitting: Primary Care

## 2018-06-21 DIAGNOSIS — E119 Type 2 diabetes mellitus without complications: Secondary | ICD-10-CM

## 2018-06-21 MED ORDER — FREESTYLE LIBRE 14 DAY SENSOR MISC: 1.0000 "application " | 2 refills | Status: DC

## 2018-09-25 ENCOUNTER — Other Ambulatory Visit: Payer: Self-pay | Admitting: Primary Care

## 2018-09-25 DIAGNOSIS — M5442 Lumbago with sciatica, left side: Principal | ICD-10-CM

## 2018-09-25 DIAGNOSIS — G8929 Other chronic pain: Secondary | ICD-10-CM

## 2018-09-27 ENCOUNTER — Other Ambulatory Visit: Payer: Self-pay | Admitting: Primary Care

## 2018-09-27 DIAGNOSIS — E785 Hyperlipidemia, unspecified: Secondary | ICD-10-CM

## 2018-09-27 DIAGNOSIS — E119 Type 2 diabetes mellitus without complications: Secondary | ICD-10-CM

## 2018-09-27 NOTE — Telephone Encounter (Signed)
Last prescribed on 04/14/2018 Last office visit on 06/10/2018. Next CPE on 10/07/2018

## 2018-09-27 NOTE — Telephone Encounter (Signed)
Noted, refill sent to pharmacy. 

## 2018-10-04 ENCOUNTER — Other Ambulatory Visit: Payer: No Typology Code available for payment source

## 2018-10-05 ENCOUNTER — Other Ambulatory Visit: Payer: No Typology Code available for payment source

## 2018-10-07 ENCOUNTER — Encounter: Payer: No Typology Code available for payment source | Admitting: Primary Care

## 2018-10-07 DIAGNOSIS — Z0289 Encounter for other administrative examinations: Secondary | ICD-10-CM

## 2018-10-08 ENCOUNTER — Other Ambulatory Visit: Payer: Self-pay | Admitting: Primary Care

## 2018-10-08 DIAGNOSIS — E119 Type 2 diabetes mellitus without complications: Secondary | ICD-10-CM

## 2018-10-11 NOTE — Telephone Encounter (Signed)
Last prescribed on 09/29/2017 Last office visit on 06/10/2018. Patient no show on 10/07/2018

## 2018-10-11 NOTE — Telephone Encounter (Signed)
Patient needs office visit follow up for diabetes in late February 2020. Raquel Sarna, will you please schedule?

## 2018-10-13 NOTE — Telephone Encounter (Signed)
Attempted to call pt and received no answer.

## 2018-10-21 ENCOUNTER — Encounter: Payer: Self-pay | Admitting: Primary Care

## 2018-10-21 NOTE — Telephone Encounter (Signed)
Unable to reach pt. Mailed letter. 

## 2018-11-22 ENCOUNTER — Other Ambulatory Visit: Payer: Self-pay | Admitting: Primary Care

## 2018-11-22 DIAGNOSIS — M5442 Lumbago with sciatica, left side: Principal | ICD-10-CM

## 2018-11-22 DIAGNOSIS — G8929 Other chronic pain: Secondary | ICD-10-CM

## 2018-11-22 NOTE — Telephone Encounter (Signed)
Last prescribed on 01/22/2018 # 30 with 0  refills. Last office visit on 06/10/2018. No future appointment

## 2018-11-22 NOTE — Telephone Encounter (Signed)
Noted, refill sent to pharmacy. 

## 2019-02-03 ENCOUNTER — Other Ambulatory Visit: Payer: Self-pay | Admitting: Primary Care

## 2019-02-03 DIAGNOSIS — E119 Type 2 diabetes mellitus without complications: Secondary | ICD-10-CM

## 2019-03-12 ENCOUNTER — Other Ambulatory Visit: Payer: Self-pay | Admitting: Primary Care

## 2019-03-12 DIAGNOSIS — G8929 Other chronic pain: Secondary | ICD-10-CM

## 2019-03-12 DIAGNOSIS — M5442 Lumbago with sciatica, left side: Principal | ICD-10-CM

## 2019-03-21 ENCOUNTER — Other Ambulatory Visit: Payer: Self-pay | Admitting: Primary Care

## 2019-03-21 DIAGNOSIS — G8929 Other chronic pain: Secondary | ICD-10-CM

## 2019-03-21 DIAGNOSIS — M5442 Lumbago with sciatica, left side: Principal | ICD-10-CM

## 2019-03-21 NOTE — Telephone Encounter (Signed)
Electronic refill request. Cyclobenzaprine Last office visit:   06/10/18 Last Filled:    30 tablet 0 11/22/2018  Please advise.

## 2019-03-22 NOTE — Telephone Encounter (Signed)
I called, no vm set up so I sent a mychart msg asking pt to call to set up appt for annual follow up before requesting future refills.

## 2019-03-22 NOTE — Telephone Encounter (Signed)
Patient will need follow up visit for further refills. She is also overdue for a diabetes follow up. Will you please schedule a virtual appointment?

## 2019-05-09 DIAGNOSIS — E119 Type 2 diabetes mellitus without complications: Secondary | ICD-10-CM

## 2019-05-09 MED ORDER — METFORMIN HCL 500 MG PO TABS: 500.0000 mg | ORAL_TABLET | Freq: Two times a day (BID) | ORAL | 0 refills | Status: DC

## 2019-05-17 ENCOUNTER — Other Ambulatory Visit: Payer: Self-pay

## 2019-05-17 ENCOUNTER — Other Ambulatory Visit (INDEPENDENT_AMBULATORY_CARE_PROVIDER_SITE_OTHER): Payer: Self-pay

## 2019-05-17 ENCOUNTER — Other Ambulatory Visit: Payer: Self-pay | Admitting: Primary Care

## 2019-05-17 DIAGNOSIS — E119 Type 2 diabetes mellitus without complications: Secondary | ICD-10-CM

## 2019-05-17 DIAGNOSIS — E785 Hyperlipidemia, unspecified: Secondary | ICD-10-CM

## 2019-05-17 LAB — LIPID PANEL
Cholesterol: 171 mg/dL (ref 0–200)
HDL: 41.7 mg/dL (ref >39.00)
LDL Cholesterol: 104 mg/dL — ABNORMAL HIGH (ref 0–99)
NonHDL: 128.84
Total CHOL/HDL Ratio: 4
Triglycerides: 124 mg/dL (ref 0.0–149.0)
VLDL: 24.8 mg/dL (ref 0.0–40.0)

## 2019-05-17 LAB — HEMOGLOBIN A1C: Hgb A1c MFr Bld: 5 % (ref 4.6–6.5)

## 2019-05-18 ENCOUNTER — Ambulatory Visit (INDEPENDENT_AMBULATORY_CARE_PROVIDER_SITE_OTHER): Payer: No Typology Code available for payment source | Admitting: Primary Care

## 2019-05-18 DIAGNOSIS — E119 Type 2 diabetes mellitus without complications: Secondary | ICD-10-CM

## 2019-05-18 DIAGNOSIS — G8929 Other chronic pain: Secondary | ICD-10-CM

## 2019-05-18 DIAGNOSIS — G4700 Insomnia, unspecified: Secondary | ICD-10-CM

## 2019-05-18 DIAGNOSIS — M5442 Lumbago with sciatica, left side: Secondary | ICD-10-CM

## 2019-05-18 MED ORDER — MIRTAZAPINE 15 MG PO TABS: ORAL_TABLET | ORAL | 0 refills | Status: DC

## 2019-05-18 MED ORDER — PREDNISONE 10 MG PO TABS: ORAL_TABLET | ORAL | 0 refills | Status: DC

## 2019-05-18 MED ORDER — CYCLOBENZAPRINE HCL 5 MG PO TABS: ORAL_TABLET | ORAL | 0 refills | Status: DC

## 2019-05-18 NOTE — Patient Instructions (Signed)
Start prednisone tablets. Take three tablets for 2 days, then two tablets for 2 days, then one tablet for 2 days.  I sent refills of your cyclobenzaprine medication for back pain, this may cause drowsiness.   I sent a refill of your sleeping medication mirtazipine.  Stop taking Metformin for diabetes.   Schedule a lab only appointment for three months to repeat diabetes test.  It was a pleasure to see you today! Allie Bossier, NP-C

## 2019-05-18 NOTE — Progress Notes (Signed)
Subjective:    Patient ID: Madison Herrera, female    DOB: 01/13/1971, 48 y.o.   MRN: [id number]  HPI  Virtual Visit via Video Note  I connected with Madison Herrera on 05/18/19 at  8:00 AM EDT by a video enabled telemedicine application and verified that I am speaking with the correct person using two identifiers.  Location: Patient: Home Provider: Office   I discussed the limitations of evaluation and management by telemedicine and the availability of in person appointments. The patient expressed understanding and agreed to proceed.  History of Present Illness:  Madison Herrera is a 48 year old female who presents today for follow up of chronic conditions.  1) Type 2 Diabetes:  Current medications include: Metformin 500 mg BID.  She is checking her blood glucose 3 times daily and is getting readings of:  AM fasting: 80-90 Afternoon: low 100's  Lowest reading: 76 Highest reading: 120  Last A1C: 5.0 on recent labs, 5.3 one year ago. Last Eye Exam: Completed in 2020 Last Foot Exam: Due next visit  ACE/ARB: None, urine microalbumin due Statin: None. LDL of 104 on recent labs.  2) Insomnia: Currently managed on mirtazapine 15 mg for which she takes as needed for sleep. This helps and she is wanting a refill today.   3) Chronic Back Pain: Chronic for years and previously managed on Meloxicam 15 mg, cyclobenzaprine 5 mg PRN, and gabapentin 100 mg. She's not taken any of these medications for quite some time. She is having an acute back pain flare for which began about two weeks ago. Flare is located to the bilateral lower back, mostly right with radiation down her right lower extremity. She has taken prednisone for this in the past with relief of symptoms.   She denies injury/trauma, loss of bowel/bladder control.  Observations/Objective:  Alert and oriented. Appears well, not sickly. No distress. Speaking in complete sentences.   Assessment and Plan:  See problem based  charting.  Follow Up Instructions:  Start prednisone tablets. Take three tablets for 2 days, then two tablets for 2 days, then one tablet for 2 days.  I sent refills of your cyclobenzaprine medication for back pain, this may cause drowsiness.   I sent a refill of your sleeping medication mirtazipine.  Stop taking Metformin for diabetes.   Schedule a lab only appointment for three months to repeat diabetes test.  It was a pleasure to see you today! Madison Bossier, NP-C    I discussed the assessment and treatment plan with the patient. The patient was provided an opportunity to ask questions and all were answered. The patient agreed with the plan and demonstrated an understanding of the instructions.   The patient was advised to call back or seek an in-person evaluation if the symptoms worsen or if the condition fails to improve as anticipated.     Madison Koch, NP    Review of Systems  Eyes: Negative for visual disturbance.  Respiratory: Negative for shortness of breath.   Cardiovascular: Negative for chest pain.  Musculoskeletal: Positive for back pain.  Neurological: Negative for numbness.  Psychiatric/Behavioral: Negative for sleep disturbance.   Past Medical History:  Diagnosis Date  . Allergic rhinitis   . Allergy   . Neuromuscular disorder ([place])    hand and feet -  tx with neurotin  . Neuropathy   . Type 2 diabetes mellitus ([place])      Social History   Socioeconomic History  . Marital status: Divorced  Spouse name: Not on file  . Number of children: Not on file  . Years of education: Not on file  . Highest education level: Not on file  Occupational History  . Not on file  Social Needs  . Financial resource strain: Not on file  . Food insecurity    Worry: Not on file    Inability: Not on file  . Transportation needs    Medical: Not on file    Non-medical: Not on file  Tobacco Use  . Smoking status: Former Smoker    Packs/day: 0.25    Years:  0.50    Pack years: 0.12    Types: Cigarettes  . Smokeless tobacco: Never Used  Substance and Sexual Activity  . Alcohol use: Yes    Comment: occ  . Drug use: No  . Sexual activity: Not Currently    Partners: Male    Birth control/protection: Surgical  Lifestyle  . Physical activity    Days per week: Not on file    Minutes per session: Not on file  . Stress: Not on file  Relationships  . Social Herbalist on phone: Not on file    Gets together: Not on file    Attends religious service: Not on file    Active member of club or organization: Not on file    Attends meetings of clubs or organizations: Not on file    Relationship status: Not on file  . Intimate partner violence    Fear of current or ex partner: Not on file    Emotionally abused: Not on file    Physically abused: Not on file    Forced sexual activity: Not on file  Other Topics Concern  . Not on file  Social History Narrative   Divorced.   2 daughters, 1 son.   Self employed.   Enjoys spending time at [employer].     Past Surgical History:  Procedure Laterality Date  . BTL    . CESAREAN SECTION     x 2  . DILITATION & CURRETTAGE/HYSTROSCOPY WITH HYDROTHERMAL ABLATION N/A 08/19/2013   Procedure: DILATATION & CURETTAGE/HYSTEROSCOPY WITH HYDROTHERMAL ABLATION;  Surgeon: Shelly Bombard, MD;  Location: [facility];  Service: Gynecology;  Laterality: N/A;  . TONSILLECTOMY AND ADENOIDECTOMY     age 48    Family History  Problem Relation Age of Onset  . Cancer - Lung Mother   . Diabetes Mother   . Diabetes Father   . Hypertension Father   . Cancer - Colon Brother   . Stomach cancer Brother   . Uterine cancer Paternal Aunt   . Healthy Brother   . Healthy Sister        x2  . Heart disease Neg Hx     Allergies  Allergen Reactions  . Latex Rash    fever  . Penicillins Rash  . Trazodone And Nefazodone Other (See Comments)    Vivid dreams/nightmares    Current Outpatient Medications on File  Prior to Visit  Medication Sig Dispense Refill  . Blood Glucose Monitoring Suppl (ONE TOUCH ULTRA 2) w/Device KIT Use as instructed to test blood sugar 2 times daily 1 each 0  . Continuous Blood Gluc Receiver (FREESTYLE LIBRE 14 DAY READER) DEVI 1 Device by Does not apply route 3 (three) times daily. 1 Device 0  . Continuous Blood Gluc Sensor (FREESTYLE LIBRE 14 DAY SENSOR) MISC Place 1 application onto the skin every 14 (fourteen) days. USE  AS DIRECTED WITH RECEIVER 6 each 2  . fexofenadine (ALLEGRA) 60 MG tablet Take 1 tablet (60 mg total) 2 (two) times daily by mouth. 180 tablet 1  . fluticasone (FLONASE) 50 MCG/ACT nasal spray Place 2 sprays into both nostrils daily. 16 g 1  . glucose blood (ONE TOUCH ULTRA TEST) test strip Use as instructed to test blood sugar 2 times daily 100 each 5  . Lancets (ONETOUCH ULTRASOFT) lancets Use as instructed to test blood sugar 2 times daily 100 each 5   No current facility-administered medications on file prior to visit.     There were no vitals taken for this visit.      Objective:   Physical Exam  Constitutional: She is oriented to person, place, and time. She appears well-nourished.  Respiratory: Effort normal.  Musculoskeletal:     Comments: Decrease in ROM to lumbar back with flexion and extension.  Neurological: She is alert and oriented to person, place, and time.  Psychiatric: She has a normal mood and affect.           Assessment & Plan:

## 2019-05-18 NOTE — Assessment & Plan Note (Signed)
Doing well on PRN mirtazapine, refill sent to pharmacy.

## 2019-05-18 NOTE — Assessment & Plan Note (Signed)
Very well controlled and no longer in the diabetic range based off of A1C tests over the years.  Will stop metformin for now. Repeat A1C in three months to ensure stability.  Encouraged a healthy diet and regular exercise.

## 2019-05-18 NOTE — Addendum Note (Signed)
Addended by: [name] on: 05/18/2019 10:24 AM   Modules accepted: Orders

## 2019-05-18 NOTE — Assessment & Plan Note (Signed)
Not taking anything on a regular basis. Insurance would not cover physical medicine referral. Acute on chronic issue today.  Rx for prednisone course and refill provided for Flexeril. She will update if no improvement.  Consider gabapentin in the future. May need to discontinue mirtazapine.

## 2019-06-17 NOTE — Telephone Encounter (Signed)
Madison Herrera, can you look into this?

## 2019-06-20 MED ORDER — DEXCOM G6 SENSOR MISC: 1.0000 "application " | Freq: Every day | 3 refills | Status: DC

## 2019-06-20 MED ORDER — DEXCOM G6 TRANSMITTER MISC: 1.0000 | Freq: Every day | 0 refills | Status: DC

## 2019-06-20 MED ORDER — DEXCOM G6 RECEIVER DEVI: 1.0000 "application " | Freq: Every day | 2 refills | Status: DC

## 2019-06-26 ENCOUNTER — Other Ambulatory Visit: Payer: Self-pay | Admitting: Primary Care

## 2019-08-17 ENCOUNTER — Ambulatory Visit (INDEPENDENT_AMBULATORY_CARE_PROVIDER_SITE_OTHER): Payer: Self-pay | Admitting: Primary Care

## 2019-08-17 ENCOUNTER — Other Ambulatory Visit: Payer: Self-pay | Admitting: Primary Care

## 2019-08-17 ENCOUNTER — Encounter: Payer: Self-pay | Admitting: Primary Care

## 2019-08-17 ENCOUNTER — Other Ambulatory Visit: Payer: Self-pay

## 2019-08-17 ENCOUNTER — Ambulatory Visit (HOSPITAL_COMMUNITY)
Admission: RE | Admit: 2019-08-17 | Discharge: 2019-08-17 | Disposition: A | Discharge status: Home / Self Care | Payer: Self-pay | Source: Ambulatory Visit | Attending: Primary Care | Admitting: Primary Care

## 2019-08-17 VITALS — BP 120/74 | HR 65 | Temp 98.0°F | Ht 62.5 in | Wt 183.0 lb

## 2019-08-17 DIAGNOSIS — M5440 Lumbago with sciatica, unspecified side: Secondary | ICD-10-CM

## 2019-08-17 DIAGNOSIS — M5441 Lumbago with sciatica, right side: Secondary | ICD-10-CM

## 2019-08-17 DIAGNOSIS — M5442 Lumbago with sciatica, left side: Secondary | ICD-10-CM

## 2019-08-17 DIAGNOSIS — J309 Allergic rhinitis, unspecified: Secondary | ICD-10-CM

## 2019-08-17 DIAGNOSIS — G8929 Other chronic pain: Secondary | ICD-10-CM | POA: Insufficient documentation

## 2019-08-17 DIAGNOSIS — E119 Type 2 diabetes mellitus without complications: Secondary | ICD-10-CM

## 2019-08-17 DIAGNOSIS — G4700 Insomnia, unspecified: Secondary | ICD-10-CM

## 2019-08-17 LAB — MICROALBUMIN / CREATININE URINE RATIO
Creatinine,U: 170.1 mg/dL
Microalb Creat Ratio: 0.9 mg/g (ref 0.0–30.0)
Microalb, Ur: 1.5 mg/dL (ref 0.0–1.9)

## 2019-08-17 LAB — COMPREHENSIVE METABOLIC PANEL
ALT: 12 U/L (ref 0–35)
AST: 20 U/L (ref 0–37)
Albumin: 4.3 g/dL (ref 3.5–5.2)
Alkaline Phosphatase: 52 U/L (ref 39–117)
BUN: 11 mg/dL (ref 6–23)
CO2: 26 mEq/L (ref 19–32)
Calcium: 9.4 mg/dL (ref 8.4–10.5)
Chloride: 107 mEq/L (ref 96–112)
Creatinine, Ser: 0.76 mg/dL (ref 0.40–1.20)
GFR: 97.59 mL/min (ref >60.00)
Glucose, Bld: 93 mg/dL (ref 70–99)
Potassium: 3.6 mEq/L (ref 3.5–5.1)
Sodium: 141 mEq/L (ref 135–145)
Total Bilirubin: 0.6 mg/dL (ref 0.2–1.2)
Total Protein: 7.2 g/dL (ref 6.0–8.3)

## 2019-08-17 LAB — LIPID PANEL
Cholesterol: 179 mg/dL (ref 0–200)
HDL: 37 mg/dL — ABNORMAL LOW (ref >39.00)
LDL Cholesterol: 118 mg/dL — ABNORMAL HIGH (ref 0–99)
NonHDL: 141.52
Total CHOL/HDL Ratio: 5
Triglycerides: 116 mg/dL (ref 0.0–149.0)
VLDL: 23.2 mg/dL (ref 0.0–40.0)

## 2019-08-17 LAB — CREATININE, SERUM
Creatinine, Ser: 0.7 mg/dL (ref 0.44–1.00)
GFR calc Af Amer: >60 mL/min (ref >60)
GFR calc non Af Amer: >60 mL/min (ref >60)

## 2019-08-17 MED ORDER — GADOBUTROL 1 MMOL/ML IV SOLN
8.0000 mL | Freq: Once | INTRAVENOUS | Status: AC | PRN
  Administered 2019-08-17: 18:00:00 8 mL via INTRAVENOUS

## 2019-08-17 MED ORDER — FREESTYLE LIBRE 2 SENSOR SYSTM MISC: 1.0000 | 2 refills | Status: DC

## 2019-08-17 MED ORDER — FREESTYLE LIBRE 14 DAY SENSOR MISC: 1.0000 | 2 refills | Status: DC

## 2019-08-17 MED ORDER — CYCLOBENZAPRINE HCL 5 MG PO TABS: ORAL_TABLET | ORAL | 0 refills | Status: DC

## 2019-08-17 MED ORDER — FREESTYLE LIBRE 2 READER SYSTM DEVI: 1.0000 | 0 refills | Status: DC

## 2019-08-17 MED ORDER — FLUTICASONE PROPIONATE 50 MCG/ACT NA SUSP: 2.0000 | Freq: Every day | NASAL | 1 refills | Status: DC

## 2019-08-17 MED ORDER — ZOLPIDEM TARTRATE 5 MG PO TABS: 5.0000 mg | ORAL_TABLET | Freq: Every evening | ORAL | 0 refills | Status: DC | PRN

## 2019-08-17 MED ORDER — MIRTAZAPINE 15 MG PO TABS: ORAL_TABLET | ORAL | 0 refills | Status: DC

## 2019-08-17 MED ORDER — FEXOFENADINE HCL 60 MG PO TABS: 60.0000 mg | ORAL_TABLET | Freq: Two times a day (BID) | ORAL | 1 refills | Status: DC

## 2019-08-17 MED ORDER — PREDNISONE 20 MG PO TABS: ORAL_TABLET | ORAL | 0 refills | Status: DC

## 2019-08-17 NOTE — Assessment & Plan Note (Addendum)
Last A1C 5.0 in June 2020. Not managed on any medication. Foot exam completed today.   Check lipid panel, CMP, urine microalbumin today. Continue to monitor blood glucose levels.  Follow-up in one year for diabetes check.  Repeat A1C in 3 months.

## 2019-08-17 NOTE — Progress Notes (Signed)
   Subjective:    Patient ID: Madison Herrera, female    DOB: [birth year], 48 y.o.   MRN: [id number]  HPI  Mrs. Madison Herrera is a 48 y.o. woman with a history of type 2 diabetes, chronic low back pain, dysmenorrhea, and insomnia who presents today with a chief complaint of back pain. She was previously managed on meloxicam, flexeril and gabapentin. She was last seen in July for back pain and was prescribed oral prednisone with relief.   She has been having constant pain for the last month. It has worsened from her chronic lower back pain and is now affecting her gait. She states that her back has "given out several times" and she has fallen three times. Her last fall was one month ago. The pain is in her R middle lower back and radiates to her buttocks and down the R leg. She also reports intermittent numbness to her R leg.She rates the pain as a 8-10/10. She is taking tylenol, advil, goody powder with no relief. She is out of her flexeril and is requesting a refill today. She has difficulty raising her R leg.  . She denies bowel or bladder changes.  Review of Systems  Respiratory: Negative for shortness of breath.   Cardiovascular: Negative for chest pain.  Gastrointestinal: Negative for constipation and diarrhea.  Genitourinary: Negative.   Musculoskeletal: Positive for back pain and gait problem.       R middle lower back pain with radiculopathy to R buttocks/leg. Recent falls, gait changes.   Neurological: Positive for numbness. Negative for dizziness.       Intermittent numbness to R leg.        Objective:   Physical Exam Cardiovascular:     Rate and Rhythm: Normal rate and regular rhythm.     Heart sounds: Normal heart sounds.  Pulmonary:     Effort: Pulmonary effort is normal.     Breath sounds: Normal breath sounds.  Musculoskeletal:     Lumbar back: She exhibits pain.       Back:       Legs:  Neurological:     Deep Tendon Reflexes:     Reflex Scores:      Patellar reflexes are  2+ on the right side and 2+ on the left side.          Assessment & Plan:

## 2019-08-17 NOTE — Progress Notes (Signed)
Subjective:    Patient ID: Madison Herrera, female    DOB: January 18, 1971, 48 y.o.   MRN: [id number]  HPI  [name] is a 48 year old female with a history of chronic back pain, type 2 diabetes who presents today with a chief complaint of back pain. She is also due for some routine labs and medication refills.  History of chronic back pain bilaterally for years. Previously managed on Meloxicam 15 mg, gabapentin 100 mg, and cyclobenzaprine in the past. She was last evaluated for an acute on chronic flare in July 2020, improved with prednisone taper and cyclobenzaprine.  Today she endorses a constant pain to the right mid lower back with radiation down to her right buttocks and right lower extremity that began about one month ago. She feels as though her "back has given out" which resulted in three falls within the last one month. She does have intermittent numbness to right lower extremity. She's been taking Advil, Tylenol, and Goody Powder without much improvement.   Her acute on chronic back pain has affected her gait, now has difficulty raising her right lower extremity. She denies changes in bowel or bladder.    Review of Systems  Musculoskeletal: Positive for arthralgias and back pain.  Skin: Negative for color change.  Neurological: Positive for numbness.       Past Medical History:  Diagnosis Date  . Allergic rhinitis   . Allergy   . Neuromuscular disorder ([place])    hand and feet -  tx with neurotin  . Neuropathy   . Type 2 diabetes mellitus ([place])      Social History   Socioeconomic History  . Marital status: Married    Spouse name: Not on file  . Number of children: Not on file  . Years of education: Not on file  . Highest education level: Not on file  Occupational History  . Not on file  Social Needs  . Financial resource strain: Not on file  . Food insecurity    Worry: Not on file    Inability: Not on file  . Transportation needs    Medical: Not on file   Non-medical: Not on file  Tobacco Use  . Smoking status: Former Smoker    Packs/day: 0.25    Years: 0.50    Pack years: 0.12    Types: Cigarettes  . Smokeless tobacco: Never Used  Substance and Sexual Activity  . Alcohol use: Yes    Comment: occ  . Drug use: No  . Sexual activity: Not Currently    Partners: Male    Birth control/protection: Surgical  Lifestyle  . Physical activity    Days per week: Not on file    Minutes per session: Not on file  . Stress: Not on file  Relationships  . Social Herbalist on phone: Not on file    Gets together: Not on file    Attends religious service: Not on file    Active member of club or organization: Not on file    Attends meetings of clubs or organizations: Not on file    Relationship status: Not on file  . Intimate partner violence    Fear of current or ex partner: Not on file    Emotionally abused: Not on file    Physically abused: Not on file    Forced sexual activity: Not on file  Other Topics Concern  . Not on file  Social History Narrative  Divorced.   2 daughters, 1 son.   Self employed.   Enjoys spending time at [employer].     Past Surgical History:  Procedure Laterality Date  . BTL    . CESAREAN SECTION     x 2  . DILITATION & CURRETTAGE/HYSTROSCOPY WITH HYDROTHERMAL ABLATION N/A 08/19/2013   Procedure: DILATATION & CURETTAGE/HYSTEROSCOPY WITH HYDROTHERMAL ABLATION;  Surgeon: Shelly Bombard, MD;  Location: [facility];  Service: Gynecology;  Laterality: N/A;  . TONSILLECTOMY AND ADENOIDECTOMY     age 54    Family History  Problem Relation Age of Onset  . Cancer - Lung Mother   . Diabetes Mother   . Diabetes Father   . Hypertension Father   . Cancer - Colon Brother   . Stomach cancer Brother   . Uterine cancer Paternal Aunt   . Healthy Brother   . Healthy Sister        x2  . Heart disease Neg Hx     Allergies  Allergen Reactions  . Latex Rash    fever  . Penicillins Rash  . Trazodone And  Nefazodone Other (See Comments)    Vivid dreams/nightmares    No current outpatient medications on file prior to visit.   No current facility-administered medications on file prior to visit.     BP 120/74   Pulse 65   Temp 98 F (36.7 C) (Temporal)   Ht 5' 2.5" (1.588 m)   Wt 183 lb (83 kg)   SpO2 97%   BMI 32.94 kg/m    Objective:   Physical Exam  Constitutional: She appears well-nourished.  Neck: Neck supple.  Cardiovascular: Normal rate and regular rhythm.  Respiratory: Effort normal and breath sounds normal.  Musculoskeletal:     Comments: Negative straight leg raise on exam but obvious weakness when raising right lower extremity on exam table. Also altered gait with right lower extremity.  5/5 strength to bilateral lower extremities and toes.  Skin: Skin is warm and dry.  Psychiatric: She has a normal mood and affect.           Assessment & Plan:

## 2019-08-17 NOTE — Assessment & Plan Note (Addendum)
Acute on chronic R middle lower back pain with radiculopathy to R leg, falls and gait changes. No bowel or bladder changes.   Exam today concerning for foot drop, especially given recurrent falls with disturbed gait. For this reason we will place a stat referral to neurosurgery for further evaluation.  Prescription for prednisone and cyclobenzaprine sent today.

## 2019-08-17 NOTE — Patient Instructions (Signed)
Start prednisone tablets. Take two tablets for 4 days, then one tablet for four days.  You can take the cyclobenzaprine as needed for muscle spasms.  Stop by the front desk and speak with either Charmaine or Rosaria Ferries regarding your referral to Neurosurgery.  Stop by the lab prior to leaving today. I will notify you of your results once received.   It was a pleasure to see you today!

## 2019-08-17 NOTE — Assessment & Plan Note (Addendum)
Doing well on mirtazapine for sleep but this is cost prohibitive. She did well on Ambien in the past and would like to retry. This may be too expensive but she will update.    Rx for Ambien 5 mg sent to pharmacy.

## 2019-10-04 ENCOUNTER — Other Ambulatory Visit: Payer: Self-pay

## 2019-10-04 DIAGNOSIS — M5441 Lumbago with sciatica, right side: Secondary | ICD-10-CM

## 2019-10-04 NOTE — Telephone Encounter (Signed)
Did she see neurosurgery as we referred her back in late September? What is the plan?

## 2019-10-04 NOTE — Telephone Encounter (Signed)
Electronic refill request Flexeril Last refill 08/17/19 #30 Last office visit 08/17/19

## 2019-10-07 ENCOUNTER — Ambulatory Visit
Admission: EM | Admit: 2019-10-07 | Discharge: 2019-10-07 | Disposition: A | Discharge status: Home / Self Care | Payer: Self-pay | Attending: Emergency Medicine | Admitting: Emergency Medicine

## 2019-10-07 ENCOUNTER — Other Ambulatory Visit: Payer: Self-pay

## 2019-10-07 DIAGNOSIS — W01119A Fall on same level from slipping, tripping and stumbling with subsequent striking against unspecified sharp object, initial encounter: Secondary | ICD-10-CM

## 2019-10-07 DIAGNOSIS — S61215A Laceration without foreign body of left ring finger without damage to nail, initial encounter: Secondary | ICD-10-CM

## 2019-10-07 DIAGNOSIS — Z23 Encounter for immunization: Secondary | ICD-10-CM

## 2019-10-07 MED ORDER — IBUPROFEN 800 MG PO TABS: 800.0000 mg | ORAL_TABLET | Freq: Three times a day (TID) | ORAL | 0 refills | Status: DC | PRN

## 2019-10-07 MED ORDER — CEPHALEXIN 500 MG PO CAPS: 500.0000 mg | ORAL_CAPSULE | Freq: Three times a day (TID) | ORAL | 0 refills | Status: DC

## 2019-10-07 MED ORDER — TETANUS-DIPHTH-ACELL PERTUSSIS 5-2.5-18.5 LF-MCG/0.5 IM SUSP
0.5000 mL | Freq: Once | INTRAMUSCULAR | Status: AC
  Administered 2019-10-07: 0.5 mL via INTRAMUSCULAR

## 2019-10-07 MED ORDER — SULFAMETHOXAZOLE-TRIMETHOPRIM 800-160 MG PO TABS: 1.0000 | ORAL_TABLET | Freq: Two times a day (BID) | ORAL | 0 refills | Status: AC

## 2019-10-07 NOTE — ED Provider Notes (Signed)
Roderic Palau    CSN: [id number] Arrival date & time: 10/07/19  1321      History   Chief Complaint Chief Complaint  Patient presents with  . Finger Injury    HPI Madison Herrera is a 48 y.o. female.   Patient presents with wound to her left fourth finger which occurred on 10/05/2019.  She states she tripped and fell; she cut her finger when she landed.  She denies numbness, paresthesias, or weakness in her finger; and she reports full range of motion without tenderness.  She denies LOC, head injury, or other injury.  She denies fever, chills, drainage, redness, or other signs of infection.  She is unsure of her last tetanus shot.  The history is provided by the patient.    Past Medical History:  Diagnosis Date  . Allergic rhinitis   . Allergy   . Neuromuscular disorder ([place])    hand and feet -  tx with neurotin  . Neuropathy   . Type 2 diabetes mellitus [facility])     Patient Active Problem List   Diagnosis Date Noted  . Preventative health care 09/29/2017  . Chronic low back pain 04/29/2017  . Insomnia 04/29/2017  . Type 2 diabetes mellitus ([place]) 04/29/2017  . Excessive or frequent menstruation 07/01/2013  . Dysmenorrhea 07/01/2013  . Leiomyoma of uterus, unspecified 07/01/2013    Past Surgical History:  Procedure Laterality Date  . BTL    . CESAREAN SECTION     x 2  . DILITATION & CURRETTAGE/HYSTROSCOPY WITH HYDROTHERMAL ABLATION N/A 08/19/2013   Procedure: DILATATION & CURETTAGE/HYSTEROSCOPY WITH HYDROTHERMAL ABLATION;  Surgeon: Shelly Bombard, MD;  Location: [facility];  Service: Gynecology;  Laterality: N/A;  . TONSILLECTOMY AND ADENOIDECTOMY     age 89    OB History    Gravida  3   Para  2   Term  2   Preterm      AB  1   Living  2     SAB      TAB      Ectopic      Multiple      Live Births  2            Home Medications    Prior to Admission medications   Medication Sig Start Date End Date Taking? Authorizing Provider   Continuous Blood Gluc Receiver (FREESTYLE LIBRE 2 READER SYSTM) DEVI Inject 1 Device into the skin continuous. 08/17/19  Yes [name], NP  Continuous Blood Gluc Sensor (FREESTYLE LIBRE 14 DAY SENSOR) MISC Inject 1 each into the skin continuous. 08/17/19  Yes [name], NP  Continuous Blood Gluc Sensor (FREESTYLE LIBRE 2 SENSOR SYSTM) MISC Inject 1 each into the skin continuous. 08/17/19  Yes [name], NP  cyclobenzaprine (FLEXERIL) 5 MG tablet TAKE 1 TABLET(5 MG) BY MOUTH TWICE DAILY AS NEEDED FOR MUSCLE SPASMS 08/17/19  Yes [name], NP  fexofenadine (ALLEGRA) 60 MG tablet Take 1 tablet (60 mg total) by mouth 2 (two) times daily. 08/17/19  Yes [name], NP  fluticasone (FLONASE) 50 MCG/ACT nasal spray Place 2 sprays into both nostrils daily. 08/17/19  Yes [name], NP  zolpidem (AMBIEN) 5 MG tablet Take 1 tablet (5 mg total) by mouth at bedtime as needed for sleep. 08/17/19  Yes [name], NP  ibuprofen (ADVIL) 800 MG tablet Take 1 tablet (800 mg total) by mouth every 8 (eight) hours as needed. 10/07/19  Sharion Balloon, NP  predniSONE (DELTASONE) 20 MG tablet Take 2 tablets for four days, then 1 tablet for four days. 08/17/19   [name], NP  sulfamethoxazole-trimethoprim (BACTRIM DS) 800-160 MG tablet Take 1 tablet by mouth 2 (two) times daily for 7 days. 10/07/19 10/14/19  Sharion Balloon, NP    Family History Family History  Problem Relation Age of Onset  . Cancer - Lung Mother   . Diabetes Mother   . Diabetes Father   . Hypertension Father   . Cancer - Colon Brother   . Stomach cancer Brother   . Uterine cancer Paternal Aunt   . Healthy Brother   . Healthy Sister        x2  . Heart disease Neg Hx     Social History Social History   Tobacco Use  . Smoking status: Former Smoker    Packs/day: 0.25    Years: 0.50    Pack years: 0.12    Types: Cigarettes  . Smokeless tobacco: Never Used  Substance Use Topics  .  Alcohol use: Yes    Comment: occ  . Drug use: No     Allergies   Latex, Penicillins, and Trazodone and nefazodone   Review of Systems Review of Systems  Constitutional: Negative for chills and fever.  HENT: Negative for ear pain and sore throat.   Eyes: Negative for pain and visual disturbance.  Respiratory: Negative for cough and shortness of breath.   Cardiovascular: Negative for chest pain and palpitations.  Gastrointestinal: Negative for abdominal pain and vomiting.  Genitourinary: Negative for dysuria and hematuria.  Musculoskeletal: Negative for arthralgias and back pain.  Skin: Positive for wound. Negative for color change and rash.  Neurological: Negative for seizures, syncope, weakness and numbness.  All other systems reviewed and are negative.    Physical Exam Triage Vital Signs ED Triage Vitals  Enc Vitals Group     BP      Pulse      Resp      Temp      Temp src      SpO2      Weight      Height      Head Circumference      Peak Flow      Pain Score      Pain Loc      Pain Edu?      Excl. in [place]?    No data found.  Updated Vital Signs BP 135/89   Pulse 77   Temp 99.5 F (37.5 C) (Oral)   Resp 18   Wt 189 lb (85.7 kg)   LMP 09/16/2019 (Exact Date)   SpO2 98%   BMI 34.02 kg/m   Visual Acuity Right Eye Distance:   Left Eye Distance:   Bilateral Distance:    Right Eye Near:   Left Eye Near:    Bilateral Near:     Physical Exam Vitals signs and nursing note reviewed.  Constitutional:      General: She is not in acute distress.    Appearance: She is well-developed.  HENT:     Head: Normocephalic and atraumatic.     Right Ear: Tympanic membrane normal.     Left Ear: Tympanic membrane normal.     Nose: Nose normal.     Mouth/Throat:     Mouth: Mucous membranes are moist.     Pharynx: Oropharynx is clear.  Eyes:     Conjunctiva/sclera: Conjunctivae normal.  Neck:     Musculoskeletal: Neck supple.  Cardiovascular:     Rate and  Rhythm: Normal rate and regular rhythm.     Heart sounds: No murmur.  Pulmonary:     Effort: Pulmonary effort is normal. No respiratory distress.     Breath sounds: Normal breath sounds.  Abdominal:     Palpations: Abdomen is soft.     Tenderness: There is no abdominal tenderness. There is no guarding or rebound.  Musculoskeletal: Normal range of motion.  Skin:    General: Skin is warm and dry.     Capillary Refill: Capillary refill takes less than 2 seconds.     Findings: Lesion present.     Comments: Left 4th finger laceration (see picture for details); FROM, strength 5/5, no active bleeding, sensation intact.   Neurological:     General: No focal deficit present.     Mental Status: She is alert and oriented to person, place, and time.     Sensory: No sensory deficit.     Motor: No weakness.  Psychiatric:        Mood and Affect: Mood normal.        Behavior: Behavior normal.            UC Treatments / Results  Labs (all labs ordered are listed, but only abnormal results are displayed) Labs Reviewed - No data to display  EKG   Radiology No results found.  Procedures Procedures (including critical care time)  Medications Ordered in UC Medications  Tdap (BOOSTRIX) injection 0.5 mL (0.5 mLs Intramuscular Given 10/07/19 1403)    Initial Impression / Assessment and Plan / UC Course  I have reviewed the triage vital signs and the nursing notes.  Pertinent labs & imaging results that were available during my care of the patient were reviewed by me and considered in my medical decision making (see chart for details).   39-day-old laceration of the left ring finger.  Treating with Septra DS.  Wound care instructions and signs of infection discussed at length with patient.  Discussed with her that sutures would have been appropriate at the time of the injury.  Tetanus updated today.  Instructed patient to return here if she notes signs of infection.  Patient agrees to  plan of care.      Final Clinical Impressions(s) / UC Diagnoses   Final diagnoses:  Laceration of left ring finger without foreign body without damage to nail, initial encounter     Discharge Instructions     Take the antibiotic as directed.  Take the ibuprofen as needed for discomfort.  Keep your wound clean and dry.  Wash it gently twice a day with soap and water.  Apply an antibiotic cream twice a day.    Return here if you see signs of infection, such as increased pain, redness, pus-like drainage, warmth, fever, chills, or other concerning symptoms.       ED Prescriptions    Medication Sig Dispense Auth. Provider   cephALEXin (KEFLEX) 500 MG capsule  (Status: Discontinued) Take 1 capsule (500 mg total) by mouth 3 (three) times daily. 20 capsule Sharion Balloon, NP   sulfamethoxazole-trimethoprim (BACTRIM DS) 800-160 MG tablet Take 1 tablet by mouth 2 (two) times daily for 7 days. 14 tablet Barkley Boards H, NP   ibuprofen (ADVIL) 800 MG tablet Take 1 tablet (800 mg total) by mouth every 8 (eight) hours as needed. 21 tablet Sharion Balloon, NP     PDMP  not reviewed this encounter.   Sharion Balloon, NP 10/07/19 [phone]

## 2019-10-07 NOTE — Discharge Instructions (Addendum)
Take the antibiotic as directed.  Take the ibuprofen as needed for discomfort.  Keep your wound clean and dry.  Wash it gently twice a day with soap and water.  Apply an antibiotic cream twice a day.    Return here if you see signs of infection, such as increased pain, redness, pus-like drainage, warmth, fever, chills, or other concerning symptoms.

## 2019-10-07 NOTE — ED Triage Notes (Signed)
Patient in office today c/o left ring finger injury fell to ground walking through door

## 2019-11-22 ENCOUNTER — Other Ambulatory Visit (INDEPENDENT_AMBULATORY_CARE_PROVIDER_SITE_OTHER): Payer: 59

## 2019-11-22 ENCOUNTER — Other Ambulatory Visit: Payer: Self-pay

## 2019-11-22 DIAGNOSIS — E119 Type 2 diabetes mellitus without complications: Secondary | ICD-10-CM

## 2019-11-22 LAB — COMPREHENSIVE METABOLIC PANEL
ALT: 6 U/L (ref 0–35)
AST: 12 U/L (ref 0–37)
Albumin: 4.3 g/dL (ref 3.5–5.2)
Alkaline Phosphatase: 44 U/L (ref 39–117)
BUN: 9 mg/dL (ref 6–23)
CO2: 26 mEq/L (ref 19–32)
Calcium: 9.3 mg/dL (ref 8.4–10.5)
Chloride: 104 mEq/L (ref 96–112)
Creatinine, Ser: 0.68 mg/dL (ref 0.40–1.20)
GFR: 110.84 mL/min (ref >60.00)
Glucose, Bld: 118 mg/dL — ABNORMAL HIGH (ref 70–99)
Potassium: 3.5 mEq/L (ref 3.5–5.1)
Sodium: 136 mEq/L (ref 135–145)
Total Bilirubin: 0.6 mg/dL (ref 0.2–1.2)
Total Protein: 7.1 g/dL (ref 6.0–8.3)

## 2019-11-22 LAB — HEMOGLOBIN A1C: Hgb A1c MFr Bld: 5.2 % (ref 4.6–6.5)

## 2019-11-25 ENCOUNTER — Other Ambulatory Visit: Payer: Self-pay

## 2019-11-25 DIAGNOSIS — M5441 Lumbago with sciatica, right side: Secondary | ICD-10-CM

## 2019-11-25 MED ORDER — CYCLOBENZAPRINE HCL 5 MG PO TABS: ORAL_TABLET | ORAL | 0 refills | Status: DC

## 2019-11-25 NOTE — Telephone Encounter (Signed)
Last filled 08/17/2019 with no refills...Marland Kitchen please advise

## 2019-11-25 NOTE — Telephone Encounter (Signed)
Refill sent to pharmacy.   

## 2020-01-07 ENCOUNTER — Other Ambulatory Visit: Payer: Self-pay | Admitting: Primary Care

## 2020-01-07 DIAGNOSIS — G4700 Insomnia, unspecified: Secondary | ICD-10-CM

## 2020-01-09 NOTE — Telephone Encounter (Signed)
Noted, refill sent to pharmacy. 

## 2020-01-09 NOTE — Telephone Encounter (Signed)
Last prescribed on 08/17/2019 . Last appointment on 08/17/2019. No future appointment

## 2020-01-19 DIAGNOSIS — M5136 Other intervertebral disc degeneration, lumbar region: Secondary | ICD-10-CM | POA: Insufficient documentation

## 2020-01-22 ENCOUNTER — Other Ambulatory Visit: Payer: Self-pay | Admitting: Primary Care

## 2020-01-22 DIAGNOSIS — G4700 Insomnia, unspecified: Secondary | ICD-10-CM

## 2020-02-08 ENCOUNTER — Ambulatory Visit: Payer: 59 | Admitting: Primary Care

## 2020-02-10 ENCOUNTER — Other Ambulatory Visit: Payer: Self-pay

## 2020-02-10 ENCOUNTER — Ambulatory Visit (INDEPENDENT_AMBULATORY_CARE_PROVIDER_SITE_OTHER): Payer: 59 | Admitting: Primary Care

## 2020-02-10 ENCOUNTER — Encounter: Payer: Self-pay | Admitting: Primary Care

## 2020-02-10 VITALS — BP 116/76 | HR 62 | Temp 96.5°F | Ht 62.5 in | Wt 184.5 lb

## 2020-02-10 DIAGNOSIS — G4700 Insomnia, unspecified: Secondary | ICD-10-CM | POA: Diagnosis not present

## 2020-02-10 DIAGNOSIS — J309 Allergic rhinitis, unspecified: Secondary | ICD-10-CM | POA: Diagnosis not present

## 2020-02-10 DIAGNOSIS — N951 Menopausal and female climacteric states: Secondary | ICD-10-CM | POA: Diagnosis not present

## 2020-02-10 DIAGNOSIS — G8929 Other chronic pain: Secondary | ICD-10-CM

## 2020-02-10 DIAGNOSIS — M5442 Lumbago with sciatica, left side: Secondary | ICD-10-CM | POA: Diagnosis not present

## 2020-02-10 DIAGNOSIS — J302 Other seasonal allergic rhinitis: Secondary | ICD-10-CM | POA: Insufficient documentation

## 2020-02-10 MED ORDER — VENLAFAXINE HCL ER 37.5 MG PO CP24: 37.5000 mg | ORAL_CAPSULE | Freq: Every day | ORAL | 1 refills | Status: DC

## 2020-02-10 MED ORDER — FEXOFENADINE HCL 60 MG PO TABS: 60.0000 mg | ORAL_TABLET | Freq: Two times a day (BID) | ORAL | 1 refills | Status: DC

## 2020-02-10 MED ORDER — FLUTICASONE PROPIONATE 50 MCG/ACT NA SUSP: 2.0000 | Freq: Every day | NASAL | 1 refills | Status: DC

## 2020-02-10 NOTE — Assessment & Plan Note (Signed)
Alternating between mirtazapine and zolpidem.  Doing well on current regimen.   Will add in venlafaxine for vasomotor symptoms, recommended to limit use of mirtazapine given potential interactions.

## 2020-02-10 NOTE — Assessment & Plan Note (Signed)
Symptoms today represent perimenopause. She has tried OTC treatment and conservative treatment at home without improvement.  Discussed options for treatment, she is ready for Rx treatment and is open to venlafaxine.   Discussed to be careful with venlafaxine along with her other medications, including mirtazapine and gabapentin.   Rx for venlafaxine ER 37.5 mg sent to pharmacy. She will update.

## 2020-02-10 NOTE — Assessment & Plan Note (Signed)
Following with spine center.  Managed on methocarbamol, Celebrex, gabapentin. Continue same.

## 2020-02-10 NOTE — Assessment & Plan Note (Signed)
Doing well on Flonase and Allegra, refills provided.

## 2020-02-10 NOTE — Progress Notes (Signed)
Subjective:    Patient ID: Madison Herrera, female    DOB: 26-Feb-1971, 49 y.o.   MRN: [id number]  HPI  This visit occurred during the SARS-CoV-2 public health emergency.  Safety protocols were in place, including screening questions prior to the visit, additional usage of staff PPE, and extensive cleaning of exam room while observing appropriate contact time as indicated for disinfecting solutions.   Madison Herrera is a 49 year old female with a history of type 2 diabetes, dysmenorrhea, chronic low back pain, insomnia, leiomyoma of uterus who presents today with a chief complaint of hot flashes.   Symptoms include night sweats that wake her from sleep. This began about 2-3 months ago. No hot flashes during the day, but she has noticed some mood swings. She is having monthly menstrual cycles which have become lighter. History of uterine ablation about 4-5 years ago.   She's tried using a fan at night, and OTC medication for "hot flashes" without improvement.   Review of Systems  Respiratory: Negative for shortness of breath.   Cardiovascular: Negative for palpitations.  Genitourinary:       Hot flashes  Neurological: Negative for dizziness.       Past Medical History:  Diagnosis Date  . Allergic rhinitis   . Allergy   . Neuromuscular disorder ([place])    hand and feet -  tx with neurotin  . Neuropathy   . Type 2 diabetes mellitus ([place])      Social History   Socioeconomic History  . Marital status: Married    Spouse name: Not on file  . Number of children: Not on file  . Years of education: Not on file  . Highest education level: Not on file  Occupational History  . Not on file  Tobacco Use  . Smoking status: Former Smoker    Packs/day: 0.25    Years: 0.50    Pack years: 0.12    Types: Cigarettes  . Smokeless tobacco: Never Used  Substance and Sexual Activity  . Alcohol use: Yes    Comment: occ  . Drug use: No  . Sexual activity: Not Currently    Partners: Male    Birth  control/protection: Surgical  Other Topics Concern  . Not on file  Social History Narrative   Divorced.   2 daughters, 1 son.   Self employed.   Enjoys spending time at [employer].    Social Determinants of Health   Financial Resource Strain:   . Difficulty of Paying Living Expenses:   Food Insecurity:   . Worried About Charity fundraiser in the Last Year:   . Arboriculturist in the Last Year:   Transportation Needs:   . Film/video editor (Medical):   Marland Kitchen Lack of Transportation (Non-Medical):   Physical Activity:   . Days of Exercise per Week:   . Minutes of Exercise per Session:   Stress:   . Feeling of Stress :   Social Connections:   . Frequency of Communication with Friends and Family:   . Frequency of Social Gatherings with Friends and Family:   . Attends Religious Services:   . Active Member of Clubs or Organizations:   . Attends Archivist Meetings:   Marland Kitchen Marital Status:   Intimate Partner Violence:   . Fear of Current or Ex-Partner:   . Emotionally Abused:   Marland Kitchen Physically Abused:   . Sexually Abused:     Past Surgical History:  Procedure Laterality  Date  . BTL    . CESAREAN SECTION     x 2  . DILITATION & CURRETTAGE/HYSTROSCOPY WITH HYDROTHERMAL ABLATION N/A 08/19/2013   Procedure: DILATATION & CURETTAGE/HYSTEROSCOPY WITH HYDROTHERMAL ABLATION;  Surgeon: Shelly Bombard, MD;  Location: [facility];  Service: Gynecology;  Laterality: N/A;  . TONSILLECTOMY AND ADENOIDECTOMY     age 67    Family History  Problem Relation Age of Onset  . Cancer - Lung Mother   . Diabetes Mother   . Diabetes Father   . Hypertension Father   . Cancer - Colon Brother   . Stomach cancer Brother   . Uterine cancer Paternal Aunt   . Healthy Brother   . Healthy Sister        x2  . Heart disease Neg Hx     Allergies  Allergen Reactions  . Latex Rash    fever  . Penicillins Rash  . Trazodone And Nefazodone Other (See Comments)    Vivid dreams/nightmares     Current Outpatient Medications on File Prior to Visit  Medication Sig Dispense Refill  . celecoxib (CELEBREX) 100 MG capsule SMARTSIG:1 Capsule(s) By Mouth Every 12 Hours PRN    . Continuous Blood Gluc Receiver (FREESTYLE LIBRE 14 DAY READER) DEVI FreeStyle Libre 14 Day Reader  INJECT 1 DEVICE INTO SKIN CONTIUOUS    . Continuous Blood Gluc Receiver (FREESTYLE LIBRE 2 READER SYSTM) DEVI Inject 1 Device into the skin continuous. 1 each 0  . gabapentin (NEURONTIN) 300 MG capsule Take 300 mg by mouth 3 (three) times daily.    . methocarbamol (ROBAXIN) 500 MG tablet Take 500-1,000 mg by mouth every 6 (six) hours as needed.    . mirtazapine (REMERON) 15 MG tablet Take 15 mg by mouth at bedtime.    Marland Kitchen zolpidem (AMBIEN) 5 MG tablet TAKE 1 TABLET(5 MG) BY MOUTH AT BEDTIME AS NEEDED FOR SLEEP 90 tablet 0   No current facility-administered medications on file prior to visit.    BP 116/76   Pulse 62   Temp (!) 96.5 F (35.8 C) (Temporal)   Ht 5' 2.5" (1.588 m)   Wt 184 lb 8 oz (83.7 kg)   SpO2 98%   BMI 33.21 kg/m    Objective:   Physical Exam  Constitutional: She appears well-nourished.  Cardiovascular: Normal rate and regular rhythm.  Respiratory: Effort normal and breath sounds normal.  Musculoskeletal:     Cervical back: Neck supple.  Skin: Skin is warm and dry.  Psychiatric: She has a normal mood and affect.           Assessment & Plan:

## 2020-02-10 NOTE — Patient Instructions (Addendum)
Start venlafaxine ER 37.5 mg once daily for hot flashes.  Be careful when taking mirtazapine and gabapentin. Continue to alternate the mirtazapine.  Please update me in 3-4 weeks.  It was a pleasure to see you today!

## 2020-03-31 ENCOUNTER — Other Ambulatory Visit: Payer: Self-pay | Admitting: Primary Care

## 2020-03-31 DIAGNOSIS — M5441 Lumbago with sciatica, right side: Secondary | ICD-10-CM

## 2020-04-07 ENCOUNTER — Other Ambulatory Visit: Payer: Self-pay | Admitting: Primary Care

## 2020-04-07 DIAGNOSIS — G4700 Insomnia, unspecified: Secondary | ICD-10-CM

## 2020-04-09 NOTE — Telephone Encounter (Signed)
Have not prescribed. Last OV on 02/28/2020. No future OV scheduled

## 2020-04-10 NOTE — Telephone Encounter (Signed)
Refill sent to pharmacy.   

## 2020-04-12 ENCOUNTER — Other Ambulatory Visit: Payer: Self-pay | Admitting: Primary Care

## 2020-04-12 DIAGNOSIS — N951 Menopausal and female climacteric states: Secondary | ICD-10-CM

## 2020-04-13 ENCOUNTER — Other Ambulatory Visit: Payer: Self-pay | Admitting: Primary Care

## 2020-04-13 DIAGNOSIS — G8929 Other chronic pain: Secondary | ICD-10-CM

## 2020-04-13 NOTE — Telephone Encounter (Signed)
Tried to call patient VM is full. Sent a message through [employer]

## 2020-04-19 NOTE — Telephone Encounter (Signed)
Ok to refill? Spoken to patient and she stated that it helps a little. It is better compare to what it was before. Patient would like a refill

## 2020-04-19 NOTE — Telephone Encounter (Signed)
Refill sent to pharmacy.   

## 2020-05-25 DIAGNOSIS — M5442 Lumbago with sciatica, left side: Secondary | ICD-10-CM

## 2020-05-25 MED ORDER — METHOCARBAMOL 500 MG PO TABS: 500.0000 mg | ORAL_TABLET | Freq: Three times a day (TID) | ORAL | 0 refills | Status: DC | PRN

## 2020-05-25 NOTE — Telephone Encounter (Signed)
Last prescribed on 11/25/2019 Last OV (acute) with Allie Bossier on 02/10/2020 No future OV scheduled

## 2020-05-28 DIAGNOSIS — G8929 Other chronic pain: Secondary | ICD-10-CM

## 2020-05-28 MED ORDER — GABAPENTIN 300 MG PO CAPS: 300.0000 mg | ORAL_CAPSULE | Freq: Two times a day (BID) | ORAL | 0 refills | Status: DC | PRN

## 2020-05-28 NOTE — Telephone Encounter (Signed)
Have not  prescribed Last OV (acute) with Allie Bossier on 02/10/2020 No future OV scheduled

## 2020-07-14 ENCOUNTER — Other Ambulatory Visit: Payer: Self-pay | Admitting: Primary Care

## 2020-07-14 DIAGNOSIS — M5442 Lumbago with sciatica, left side: Secondary | ICD-10-CM

## 2020-07-16 ENCOUNTER — Other Ambulatory Visit: Payer: Self-pay | Admitting: Primary Care

## 2020-07-16 DIAGNOSIS — J309 Allergic rhinitis, unspecified: Secondary | ICD-10-CM

## 2020-07-17 ENCOUNTER — Ambulatory Visit: Payer: 59 | Admitting: Primary Care

## 2020-07-17 DIAGNOSIS — Z0289 Encounter for other administrative examinations: Secondary | ICD-10-CM

## 2020-07-30 ENCOUNTER — Encounter: Payer: Self-pay | Admitting: Primary Care

## 2020-07-30 ENCOUNTER — Other Ambulatory Visit: Payer: Self-pay

## 2020-07-30 ENCOUNTER — Ambulatory Visit (INDEPENDENT_AMBULATORY_CARE_PROVIDER_SITE_OTHER): Payer: Self-pay | Admitting: Primary Care

## 2020-07-30 VITALS — BP 118/62 | HR 76 | Temp 96.4°F | Ht 62.5 in | Wt 168.0 lb

## 2020-07-30 DIAGNOSIS — M5442 Lumbago with sciatica, left side: Secondary | ICD-10-CM

## 2020-07-30 DIAGNOSIS — Z1211 Encounter for screening for malignant neoplasm of colon: Secondary | ICD-10-CM

## 2020-07-30 DIAGNOSIS — G4700 Insomnia, unspecified: Secondary | ICD-10-CM

## 2020-07-30 DIAGNOSIS — G8929 Other chronic pain: Secondary | ICD-10-CM

## 2020-07-30 DIAGNOSIS — Z23 Encounter for immunization: Secondary | ICD-10-CM

## 2020-07-30 DIAGNOSIS — N951 Menopausal and female climacteric states: Secondary | ICD-10-CM

## 2020-07-30 DIAGNOSIS — E119 Type 2 diabetes mellitus without complications: Secondary | ICD-10-CM

## 2020-07-30 MED ORDER — MIRTAZAPINE 15 MG PO TABS: ORAL_TABLET | ORAL | 0 refills | Status: DC

## 2020-07-30 MED ORDER — METHOCARBAMOL 500 MG PO TABS: 500.0000 mg | ORAL_TABLET | Freq: Two times a day (BID) | ORAL | 0 refills | Status: DC | PRN

## 2020-07-30 MED ORDER — VENLAFAXINE HCL ER 37.5 MG PO CP24: ORAL_CAPSULE | ORAL | 1 refills | Status: DC

## 2020-07-30 MED ORDER — GABAPENTIN 300 MG PO CAPS: 300.0000 mg | ORAL_CAPSULE | Freq: Two times a day (BID) | ORAL | 1 refills | Status: DC | PRN

## 2020-07-30 MED ORDER — CELECOXIB 100 MG PO CAPS: ORAL_CAPSULE | ORAL | 0 refills | Status: DC

## 2020-07-30 MED ORDER — ZOLPIDEM TARTRATE 5 MG PO TABS: ORAL_TABLET | ORAL | 0 refills | Status: DC

## 2020-07-30 MED ORDER — PREDNISONE 20 MG PO TABS: ORAL_TABLET | ORAL | 0 refills | Status: DC

## 2020-07-30 NOTE — Assessment & Plan Note (Signed)
Refills provided of medications.  Newer symptoms over the last several months are suspicious. I strongly advised she contact the [facility] with an update. MRI from September 2020 reviewed.  Rx for prednisone course sent to pharmacy.

## 2020-07-30 NOTE — Patient Instructions (Addendum)
Start prednisone tablets. Take two tablets for four days, then one tablet for four days.   Please call the spine center as discussed.  It was a pleasure to see you today!    Marland KitchenMarland KitchenIt takes about 2 weeks for protection to develop after vaccination.  There are many flu viruses, and they are always changing. Each year a new flu vaccine is made to protect against three or four viruses that are likely to cause disease in the upcoming flu season. Even when the vaccine doesn't exactly match these viruses, it may still provide some protection.   Influenza vaccine does not cause flu.  Influenza vaccine may be given at the same time as other vaccines.  3. Talk with your health care provider  Tell your vaccine provider if the person getting the vaccine: ; Has had an allergic reaction after a previous dose of influenza vaccine, or has any severe, life-threatening allergies.  ; Has ever had Guillain-Barr Syndrome (also called GBS).  In some cases, your health care provider may decide to postpone influenza vaccination to a future visit.  People with minor illnesses, such as a cold, may be vaccinated. People who are moderately or severely ill should usually wait until they recover before getting influenza vaccine.  Your health care provider can give you more information.  4. Risks of a reaction  ; Soreness, redness, and swelling where shot is given, fever, muscle aches, and headache can happen after influenza vaccine. ; There may be a very small increased risk of Guillain-Barr Syndrome (GBS) after inactivated influenza vaccine (the flu shot).  Young children who get the flu shot along with pneumococcal vaccine (PCV13), and/or DTaP vaccine at the same time might be slightly more likely to have a seizure caused by fever. Tell your health care provider if a child who is getting flu vaccine has ever had a seizure.  People sometimes faint after medical procedures, including vaccination. Tell your  provider if you feel dizzy or have vision changes or ringing in the ears.  As with any medicine, there is a very remote chance of a vaccine causing a severe allergic reaction, other serious injury, or death.  5. What if there is a serious problem?  An allergic reaction could occur after the vaccinated person leaves the clinic. If you see signs of a severe allergic reaction (hives, swelling of the face and throat, difficulty breathing, a fast heartbeat, dizziness, or weakness), call 9-1-1 and get the person to the nearest hospital.  For other signs that concern you, call your health care provider.  Adverse reactions should be reported to the Vaccine Adverse Event Reporting System (VAERS). Your health care provider will usually file this report, or you can do it yourself. Visit the VAERS website at www.vaers.SamedayNews.es or call 830-312-4210.  VAERS is only for reporting reactions, and VAERS staff do not give medical advice.  6. The National Vaccine Injury Compensation Program  The [employer] Vaccine Injury Compensation Program (VICP) is a federal program that was created to compensate people who may have been injured by certain vaccines. Visit the VICP website at GoldCloset.com.ee or call (267) 666-3690 to learn about the program and about filing a claim. There is a time limit to file a claim for compensation.  7. How can I learn more?  ; Ask your health care provider.  ; Call your local or state health department. ; Contact the Centers for Disease Control and Prevention (CDC): - Call 765-634-9264 (1-800-CDC-INFO) or - Visit CDC's influenza website at  https://gibson.com/  Vaccine Information Statement (Interim) Inactivated Influenza Vaccine  07/01/2018 42 U.S.C.  [phone]   Department of Health and Geneticist, molecular for Disease Control and Prevention  Office Use Only

## 2020-07-30 NOTE — Assessment & Plan Note (Signed)
Doing well alternating with Remeron and Ambien, refills provided.

## 2020-07-30 NOTE — Assessment & Plan Note (Signed)
Doing well on venlafaxine, continue same. Refills provided.

## 2020-07-30 NOTE — Progress Notes (Signed)
Subjective:    Patient ID: Madison Herrera, adult    DOB: 20-Feb-1971, 49 y.o.   MRN: [id number]  HPI  This visit occurred during the SARS-CoV-2 public health emergency.  Safety protocols were in place, including screening questions prior to the visit, additional usage of staff PPE, and extensive cleaning of exam room while observing appropriate contact time as indicated for disinfecting solutions.   Madison Herrera is a 49 year old female with a history of chronic back pain, degeneration of lumbar spine who presents today to discuss back pain. She is also needing refills of all of her medications.   Currently managed through the [facility] in [place] for back pain and is taking Celebrex, gabapentin, methocarbamol. Overall she doesn't believe that her regimen is working.   Her pain is located to the bilateral lumbar back which has been chronic for years. She was last evaluated by the [facility] 2-3 months ago for her second lumbar spine injection which she feels is not effective. One week ago she started noticing left lower extremity swelling with pain, no numbness. She will typically receive oral steroids for flares with improvement.   About three months ago she's had a few falls. She will be walking and notice a sudden sharp pain to the middle lumbar spine which causes her to fall and lose control of her bladder. This has happened a few times. She has not yet discussed this with the Spine Center. She had a MRI of the lumbar spine in September 2020.   BP Readings from Last 3 Encounters:  07/30/20 118/62  02/10/20 116/76  10/07/19 135/89     Review of Systems  Genitourinary:       Intermittent loss of bladder control  Musculoskeletal: Positive for arthralgias and back pain.  Skin: Negative for color change.  Neurological: Positive for weakness. Negative for numbness.       Past Medical History:  Diagnosis Date  . Allergic rhinitis   . Allergy   . Neuromuscular disorder ([place])     hand and feet -  tx with neurotin  . Neuropathy   . Type 2 diabetes mellitus ([place])      Social History   Socioeconomic History  . Marital status: Married    Spouse name: Not on file  . Number of children: Not on file  . Years of education: Not on file  . Highest education level: Not on file  Occupational History  . Not on file  Tobacco Use  . Smoking status: Former Smoker    Packs/day: 0.25    Years: 0.50    Pack years: 0.12    Types: Cigarettes  . Smokeless tobacco: Never Used  Substance and Sexual Activity  . Alcohol use: Yes    Comment: occ  . Drug use: No  . Sexual activity: Not Currently    Partners: Male    Birth control/protection: Surgical  Other Topics Concern  . Not on file  Social History Narrative   Divorced.   2 daughters, 1 son.   Self employed.   Enjoys spending time at [employer].    Social Determinants of Health   Financial Resource Strain:   . Difficulty of Paying Living Expenses: Not on file  Food Insecurity:   . Worried About Charity fundraiser in the Last Year: Not on file  . Ran Out of Food in the Last Year: Not on file  Transportation Needs:   . Lack of Transportation (Medical): Not on file  .  Lack of Transportation (Non-Medical): Not on file  Physical Activity:   . Days of Exercise per Week: Not on file  . Minutes of Exercise per Session: Not on file  Stress:   . Feeling of Stress : Not on file  Social Connections:   . Frequency of Communication with Friends and Family: Not on file  . Frequency of Social Gatherings with Friends and Family: Not on file  . Attends Religious Services: Not on file  . Active Member of Clubs or Organizations: Not on file  . Attends Archivist Meetings: Not on file  . Marital Status: Not on file  Intimate Partner Violence:   . Fear of Current or Ex-Partner: Not on file  . Emotionally Abused: Not on file  . Physically Abused: Not on file  . Sexually Abused: Not on file    Past Surgical  History:  Procedure Laterality Date  . BTL    . CESAREAN SECTION     x 2  . DILITATION & CURRETTAGE/HYSTROSCOPY WITH HYDROTHERMAL ABLATION N/A 08/19/2013   Procedure: DILATATION & CURETTAGE/HYSTEROSCOPY WITH HYDROTHERMAL ABLATION;  Surgeon: Shelly Bombard, MD;  Location: [facility];  Service: Gynecology;  Laterality: N/A;  . TONSILLECTOMY AND ADENOIDECTOMY     age 63    Family History  Problem Relation Age of Onset  . Cancer - Lung Mother   . Diabetes Mother   . Diabetes Father   . Hypertension Father   . Cancer - Colon Brother   . Stomach cancer Brother   . Uterine cancer Paternal Aunt   . Healthy Brother   . Healthy Sister        x2  . Heart disease Neg Hx     Allergies  Allergen Reactions  . Latex Rash    fever  . Penicillins Rash  . Trazodone And Nefazodone Other (See Comments)    Vivid dreams/nightmares    Current Outpatient Medications on File Prior to Visit  Medication Sig Dispense Refill  . celecoxib (CELEBREX) 100 MG capsule SMARTSIG:1 Capsule(s) By Mouth Every 12 Hours PRN    . Continuous Blood Gluc Receiver (FREESTYLE LIBRE 14 DAY READER) DEVI FreeStyle Libre 14 Day Reader  INJECT 1 DEVICE INTO SKIN CONTIUOUS    . Continuous Blood Gluc Receiver (FREESTYLE LIBRE 2 READER SYSTM) DEVI Inject 1 Device into the skin continuous. 1 each 0  . fexofenadine (ALLEGRA) 60 MG tablet TAKE 1 TABLET(60 MG) BY MOUTH TWICE DAILY 180 tablet 0  . fluticasone (FLONASE) 50 MCG/ACT nasal spray SHAKE LIQUID AND USE 2 SPRAYS IN EACH NOSTRIL DAILY 16 g 2  . gabapentin (NEURONTIN) 300 MG capsule Take 1 capsule (300 mg total) by mouth 2 (two) times daily as needed. For back pain. 180 capsule 0  . methocarbamol (ROBAXIN) 500 MG tablet Take 1-2 tablets (500-1,000 mg total) by mouth every 8 (eight) hours as needed. For muscle spasms. 30 tablet 0  . mirtazapine (REMERON) 15 MG tablet TAKE 1 TABLET(15 MG) BY MOUTH AT BEDTIME FOR SLEEP 90 tablet 0  . venlafaxine XR (EFFEXOR-XR) 37.5 MG 24 hr  capsule TAKE 1 CAPSULE BY MOUTH DAILY WITH BREAKFAST FOR HOT FLASHES 90 capsule 1  . zolpidem (AMBIEN) 5 MG tablet TAKE 1 TABLET(5 MG) BY MOUTH AT BEDTIME AS NEEDED FOR SLEEP 90 tablet 0   No current facility-administered medications on file prior to visit.    BP 118/62   Pulse 76   Temp (!) 96.4 F (35.8 C) (Temporal)   Ht 5' 2.5" (  1.588 m)   Wt 168 lb (76.2 kg)   SpO2 95%   BMI 30.24 kg/m    Objective:   Physical Exam Cardiovascular:     Rate and Rhythm: Normal rate.  Pulmonary:     Effort: Pulmonary effort is normal.  Musculoskeletal:     Lumbar back: Negative right straight leg raise test and negative left straight leg raise test.       Back:     Comments: Decrease in ROM to lumbar spine with flexion and extension. Ambulatory in clinic with limp.  Neurological:     Mental Status: He is alert.            Assessment & Plan:

## 2020-08-05 ENCOUNTER — Other Ambulatory Visit: Payer: Self-pay | Admitting: Primary Care

## 2020-08-05 DIAGNOSIS — M5442 Lumbago with sciatica, left side: Secondary | ICD-10-CM

## 2020-08-16 ENCOUNTER — Telehealth: Payer: Self-pay

## 2020-08-16 NOTE — Telephone Encounter (Signed)
Unable to contact patient for her 11am nurse call to triage and schedule her colonoscopy.  Attempted at 11am and again 10 minutes after 11.  LVM for her to call office back to reschedule her telephone nurse visit.  Will send a mychart message for her as well.  Thanks,  [place], [place]

## 2020-09-20 ENCOUNTER — Other Ambulatory Visit: Payer: Self-pay | Admitting: Primary Care

## 2020-09-20 DIAGNOSIS — G8929 Other chronic pain: Secondary | ICD-10-CM

## 2020-10-28 ENCOUNTER — Other Ambulatory Visit: Payer: Self-pay | Admitting: Primary Care

## 2020-10-28 DIAGNOSIS — G8929 Other chronic pain: Secondary | ICD-10-CM

## 2020-11-08 ENCOUNTER — Other Ambulatory Visit: Payer: Self-pay

## 2020-11-08 DIAGNOSIS — G8929 Other chronic pain: Secondary | ICD-10-CM

## 2020-11-08 DIAGNOSIS — M5442 Lumbago with sciatica, left side: Secondary | ICD-10-CM

## 2020-11-08 MED ORDER — PREDNISONE 20 MG PO TABS: ORAL_TABLET | ORAL | 0 refills | Status: DC

## 2020-11-20 ENCOUNTER — Other Ambulatory Visit: Payer: Self-pay | Admitting: Primary Care

## 2020-11-20 DIAGNOSIS — G8929 Other chronic pain: Secondary | ICD-10-CM

## 2020-11-21 NOTE — Telephone Encounter (Signed)
Refill request denied, see my chart message. Rx for prednisone was ready at [facility] on [address] on 11/08/20. The pharmacy put this Rx back on the shelf after 10 days since she did not pick it up.  Will notify patient.

## 2020-11-21 NOTE — Telephone Encounter (Signed)
Okay to refill? Please advise.  

## 2020-12-11 ENCOUNTER — Other Ambulatory Visit: Payer: Self-pay | Admitting: Primary Care

## 2020-12-11 DIAGNOSIS — M5442 Lumbago with sciatica, left side: Secondary | ICD-10-CM

## 2020-12-11 DIAGNOSIS — G8929 Other chronic pain: Secondary | ICD-10-CM

## 2020-12-19 ENCOUNTER — Other Ambulatory Visit: Payer: Self-pay | Admitting: Primary Care

## 2020-12-19 ENCOUNTER — Other Ambulatory Visit: Payer: Self-pay

## 2020-12-19 DIAGNOSIS — G8929 Other chronic pain: Secondary | ICD-10-CM

## 2020-12-31 ENCOUNTER — Telehealth: Payer: Self-pay

## 2020-12-31 NOTE — Telephone Encounter (Signed)
Patient stated that she never received her medication because the pharmacy that it was sent to would not transfer it to the correct one when she spoke with them.   Patient states she is still in pain and is wanting to know if  Provider will send to correct pharmacy or will nurse call and see if medication can transferred   pharmacy patient is wanting medication sent to is: [id number] - [place], [place] [address]     Medication: predniSONE (DELTASONE) 20 MG tablet

## 2020-12-31 NOTE — Telephone Encounter (Signed)
Called they have the script and has been on hold will send to new pharmacy now.

## 2020-12-31 NOTE — Telephone Encounter (Signed)
Please call the original pharmacy to see if they have this on file Genetic[facility], [facility] on [address] in [place]). She told me that she was doing to pick this up at the [facility] on [address]. Find out why they can't [facility] it [facility] to another [facility].   I called [facility] on [address] months ago and this wasn't an issue then.

## 2020-12-31 NOTE — Telephone Encounter (Signed)
Noted  

## 2020-12-31 NOTE — Telephone Encounter (Signed)
Ok to call to new pharmacy and c/a at other?

## 2021-01-10 ENCOUNTER — Other Ambulatory Visit: Payer: Self-pay | Admitting: Primary Care

## 2021-01-10 DIAGNOSIS — E119 Type 2 diabetes mellitus without complications: Secondary | ICD-10-CM

## 2021-01-21 ENCOUNTER — Other Ambulatory Visit: Payer: Self-pay

## 2021-01-28 ENCOUNTER — Encounter: Payer: Self-pay | Admitting: Primary Care

## 2021-02-01 ENCOUNTER — Encounter: Payer: Self-pay | Admitting: Primary Care

## 2021-08-15 ENCOUNTER — Ambulatory Visit (INDEPENDENT_AMBULATORY_CARE_PROVIDER_SITE_OTHER): Payer: Self-pay | Admitting: Primary Care

## 2021-08-15 ENCOUNTER — Encounter: Payer: Self-pay | Admitting: Primary Care

## 2021-08-15 ENCOUNTER — Other Ambulatory Visit: Payer: Self-pay

## 2021-08-15 ENCOUNTER — Other Ambulatory Visit: Payer: Self-pay | Admitting: Primary Care

## 2021-08-15 VITALS — BP 114/62 | HR 58 | Temp 98.6°F | Ht 62.5 in | Wt 175.0 lb

## 2021-08-15 DIAGNOSIS — N951 Menopausal and female climacteric states: Secondary | ICD-10-CM

## 2021-08-15 DIAGNOSIS — Z23 Encounter for immunization: Secondary | ICD-10-CM

## 2021-08-15 DIAGNOSIS — G8929 Other chronic pain: Secondary | ICD-10-CM

## 2021-08-15 DIAGNOSIS — M5442 Lumbago with sciatica, left side: Secondary | ICD-10-CM

## 2021-08-15 DIAGNOSIS — E119 Type 2 diabetes mellitus without complications: Secondary | ICD-10-CM

## 2021-08-15 DIAGNOSIS — G4700 Insomnia, unspecified: Secondary | ICD-10-CM

## 2021-08-15 LAB — CBC
HCT: 41.8 % (ref 36.0–46.0)
Hemoglobin: 14.2 g/dL (ref 12.0–15.0)
MCHC: 33.9 g/dL (ref 30.0–36.0)
MCV: 96 fl (ref 78.0–100.0)
Platelets: 248 10*3/uL (ref 150.0–400.0)
RBC: 4.36 Mil/uL (ref 3.87–5.11)
RDW: 12.6 % (ref 11.5–15.5)
WBC: 7.8 10*3/uL (ref 4.0–10.5)

## 2021-08-15 LAB — COMPREHENSIVE METABOLIC PANEL
ALT: 13 U/L (ref 0–35)
AST: 14 U/L (ref 0–37)
Albumin: 4.3 g/dL (ref 3.5–5.2)
Alkaline Phosphatase: 64 U/L (ref 39–117)
BUN: 9 mg/dL (ref 6–23)
CO2: 30 mEq/L (ref 19–32)
Calcium: 9.7 mg/dL (ref 8.4–10.5)
Chloride: 102 mEq/L (ref 96–112)
Creatinine, Ser: 0.7 mg/dL (ref 0.40–1.20)
GFR: 99.95 mL/min (ref >60.00)
Glucose, Bld: 217 mg/dL — ABNORMAL HIGH (ref 70–99)
Potassium: 4 mEq/L (ref 3.5–5.1)
Sodium: 138 mEq/L (ref 135–145)
Total Bilirubin: 0.6 mg/dL (ref 0.2–1.2)
Total Protein: 6.9 g/dL (ref 6.0–8.3)

## 2021-08-15 LAB — HEMOGLOBIN A1C: Hgb A1c MFr Bld: 7 % — ABNORMAL HIGH (ref 4.6–6.5)

## 2021-08-15 LAB — LIPID PANEL
Cholesterol: 230 mg/dL — ABNORMAL HIGH (ref 0–200)
HDL: 61.4 mg/dL (ref >39.00)
LDL Cholesterol: 146 mg/dL — ABNORMAL HIGH (ref 0–99)
NonHDL: 168.45
Total CHOL/HDL Ratio: 4
Triglycerides: 113 mg/dL (ref 0.0–149.0)
VLDL: 22.6 mg/dL (ref 0.0–40.0)

## 2021-08-15 LAB — TSH: TSH: 0.68 u[IU]/mL (ref 0.35–5.50)

## 2021-08-15 MED ORDER — MIRTAZAPINE 15 MG PO TABS: ORAL_TABLET | ORAL | 1 refills | Status: DC

## 2021-08-15 MED ORDER — METHOCARBAMOL 500 MG PO TABS: ORAL_TABLET | ORAL | 0 refills | Status: DC

## 2021-08-15 MED ORDER — PAROXETINE HCL 20 MG PO TABS: 20.0000 mg | ORAL_TABLET | Freq: Every day | ORAL | 0 refills | Status: DC

## 2021-08-15 NOTE — Assessment & Plan Note (Signed)
Uncontrolled, did well on mirtazapine 15 mg.  Refills provided.

## 2021-08-15 NOTE — Assessment & Plan Note (Signed)
Following now with neurosurgery, has undergone numerous treatment regimens.   Is considering surgery.  Discussed that she can increase her gabapentin to 600 mg once daily, continue 300 mg, she does not need a refill at this time.   Refills provided for methocarbamol 500 mg.

## 2021-08-15 NOTE — Assessment & Plan Note (Signed)
Uncontrolled, has not taken venlafaxine ER in months.   Trial of paroxetine 20 mg HS sent to pharmacy. Discussed starting instructions.  I asked that she updated [place] in 1 month.

## 2021-08-15 NOTE — Patient Instructions (Addendum)
Stop by the lab prior to leaving today. I will notify you of your results once received.   Start paroxetine 20 mg nightly for hot flashes. Take 1/2 tablet daily for a few days, then increase to 1 full tablet.  I refilled your mirtazapine 15 mg tablets for sleep.   You may increase your gabapentin by taking 2 tablets in either the morning, afternoon, or at bedtime.   It was a pleasure to see you today!

## 2021-08-15 NOTE — Progress Notes (Signed)
Subjective:    Patient ID: Madison Herrera, female    DOB: July 14, 1971, 50 y.o.   MRN: [id number]  HPI  Madison Herrera is a very pleasant 50 y.o. female with a history of type 2 diabetes, chronic low back pain, dysmenorrhea, leiomyoma of uterus who presents today to discuss back pain and hot flashes. She is also overdue for updated labs.  1) Chronic Back Pain:Long history of chronic lower back pain, has been treated with oral steroids and NSAID courses previously. Previously following with Dr. Nelva Bush ([facility]) for chronic lumbar degenerative disease. Now following with Dr. Trenton Gammon (neurosurgery), has completed several lumbar spine injections and evaluated at the pain clinic without improvement.   She is now contemplating surgery as she's failed all other treatment regimens.   Currently managed on gabapentin 300 mg BID PRN and methocarbamol [phone] mg PRN for back pain.   She is actually taking gabapentin 300 mg TID, everyday and methocarbamol 500 mg BID with some improvement.   2) Hot Flashes: Managed on venlafaxine ER 37.5 mg for which she has taken for greater than one year. During her visit in September 2021 she endorsed doing well on this treatment.   Today she endorses continued hot flashes, is sweating during the day and evening. Wakes up from sleep drenched in sweat. She is not taking venlafaxine ER 37.5 mg, didn't find this effective. Is willing to try another medication.   3) Insomnia: Chronic. Did well on mirtazapine 15 mg but has not taken in nearly 9 months. She would like refills today.     BP Readings from Last 3 Encounters:  08/15/21 114/62  07/30/20 118/62  02/10/20 116/76      Review of Systems  Respiratory:  Negative for shortness of breath.   Cardiovascular:  Negative for chest pain.  Genitourinary:        Hot flashes   Musculoskeletal:  Positive for arthralgias and back pain.  Neurological:  Positive for numbness. Negative for weakness.   Psychiatric/Behavioral:  Positive for sleep disturbance.         Past Medical History:  Diagnosis Date   Allergic rhinitis    Allergy    Neuromuscular disorder ([place])    hand and feet -  tx with neurotin   Neuropathy    Type 2 diabetes mellitus ([place])     Social History   Socioeconomic History   Marital status: Married    Spouse name: Not on file   Number of children: Not on file   Years of education: Not on file   Highest education level: Not on file  Occupational History   Not on file  Tobacco Use   Smoking status: Former    Packs/day: 0.25    Years: 0.50    Pack years: 0.13    Types: Cigarettes   Smokeless tobacco: Never  Substance and Sexual Activity   Alcohol use: Yes    Comment: occ   Drug use: No   Sexual activity: Not Currently    Partners: Male    Birth control/protection: Surgical  Other Topics Concern   Not on file  Social History Narrative   Divorced.   2 daughters, 1 son.   Self employed.   Enjoys spending time at [employer].    Social Determinants of Health   Financial Resource Strain: Not on file  Food Insecurity: Not on file  Transportation Needs: Not on file  Physical Activity: Not on file  Stress: Not on file  Social Connections: Not  on file  Intimate Partner Violence: Not on file    Past Surgical History:  Procedure Laterality Date   BTL     CESAREAN SECTION     x 2   DILITATION & CURRETTAGE/HYSTROSCOPY WITH HYDROTHERMAL ABLATION N/A 08/19/2013   Procedure: DILATATION & CURETTAGE/HYSTEROSCOPY WITH HYDROTHERMAL ABLATION;  Surgeon: Shelly Bombard, MD;  Location: [facility];  Service: Gynecology;  Laterality: N/A;   TONSILLECTOMY AND ADENOIDECTOMY     age 62    Family History  Problem Relation Age of Onset   Cancer - Lung Mother    Diabetes Mother    Diabetes Father    Hypertension Father    Cancer - Colon Brother    Stomach cancer Brother    Uterine cancer Paternal Aunt    Healthy Brother    Healthy Sister        x2   Heart  disease Neg Hx     Allergies  Allergen Reactions   Latex Rash    fever   Penicillins Rash   Trazodone And Nefazodone Other (See Comments)    Vivid dreams/nightmares    Current Outpatient Medications on File Prior to Visit  Medication Sig Dispense Refill   Continuous Blood Gluc Receiver (FREESTYLE LIBRE 14 DAY READER) DEVI FreeStyle Libre 14 Day Reader  INJECT 1 DEVICE INTO SKIN CONTIUOUS     Continuous Blood Gluc Receiver (FREESTYLE LIBRE 2 READER SYSTM) DEVI Inject 1 Device into the skin continuous. 1 each 0   fexofenadine (ALLEGRA) 60 MG tablet TAKE 1 TABLET(60 MG) BY MOUTH TWICE DAILY 180 tablet 0   fluticasone (FLONASE) 50 MCG/ACT nasal spray SHAKE LIQUID AND USE 2 SPRAYS IN EACH NOSTRIL DAILY 16 g 2   gabapentin (NEURONTIN) 300 MG capsule TAKE 1 CAPSULE(300 MG) BY MOUTH TWICE DAILY AS NEEDED FOR BACK PAIN 180 capsule 1   No current facility-administered medications on file prior to visit.    BP 114/62   Pulse (!) 58   Temp 98.6 F (37 C) (Temporal)   Ht 5' 2.5" (1.588 m)   Wt 175 lb (79.4 kg)   SpO2 100%   BMI 31.50 kg/m  Objective:   Physical Exam Cardiovascular:     Rate and Rhythm: Normal rate and regular rhythm.  Pulmonary:     Effort: Pulmonary effort is normal.     Breath sounds: Normal breath sounds.  Musculoskeletal:     Cervical back: Neck supple.       Back:     Comments: Decrease in ROM to lumbar spine with forward movement and extension due to pain. Able to get up on exam table, does appear uncomfortable.  5/5 strength to bilateral lower extremities.   Skin:    General: Skin is warm and dry.          Assessment & Plan:      This visit occurred during the SARS-CoV-2 public health emergency.  Safety protocols were in place, including screening questions prior to the visit, additional usage of staff PPE, and extensive cleaning of exam room while observing appropriate contact time as indicated for disinfecting solutions.

## 2021-08-15 NOTE — Assessment & Plan Note (Signed)
Off meds for years, no recent A1C on file.  Repeat A1C pending. Continue off medications.

## 2021-08-16 DIAGNOSIS — E785 Hyperlipidemia, unspecified: Secondary | ICD-10-CM

## 2021-08-16 DIAGNOSIS — E1165 Type 2 diabetes mellitus with hyperglycemia: Secondary | ICD-10-CM

## 2021-08-18 MED ORDER — ATORVASTATIN CALCIUM 20 MG PO TABS: 20.0000 mg | ORAL_TABLET | Freq: Every day | ORAL | 1 refills | Status: DC

## 2021-08-18 MED ORDER — METFORMIN HCL ER 500 MG PO TB24: 500.0000 mg | ORAL_TABLET | Freq: Every day | ORAL | 1 refills | Status: DC

## 2021-08-19 ENCOUNTER — Other Ambulatory Visit: Payer: Self-pay | Admitting: Primary Care

## 2021-08-19 DIAGNOSIS — G4700 Insomnia, unspecified: Secondary | ICD-10-CM

## 2021-08-19 DIAGNOSIS — Z23 Encounter for immunization: Secondary | ICD-10-CM

## 2021-08-24 MED ORDER — FREESTYLE LIBRE 2 READER DEVI: 0 refills | Status: DC

## 2021-08-24 MED ORDER — FREESTYLE LIBRE 2 SENSOR MISC: 1 refills | Status: DC

## 2021-08-26 IMAGING — MR MR LUMBAR SPINE WO/W CM
4 of 7 series · 18 of 48 positions shown · IV contrast (gadavist)
Comparison: Prior radiograph from 04/29/2017.

CLINICAL DATA: Initial evaluation for acute on chronic right-sided
back pain with right lower extremity radiculopathy.

EXAM:
MRI LUMBAR SPINE WITHOUT AND WITH CONTRAST
TECHNIQUE: Multiplanar and multiecho pulse sequences of the lumbar spine were
obtained without and with intravenous contrast.
CONTRAST:  8mL GADAVIST GADOBUTROL 1 MMOL/ML IV SOLN

[Series 4: T2 · sagittal · 4.0mm · 0.51mm/px · 4 of 15 slices shown (1 of 2)]
[im 1/15]
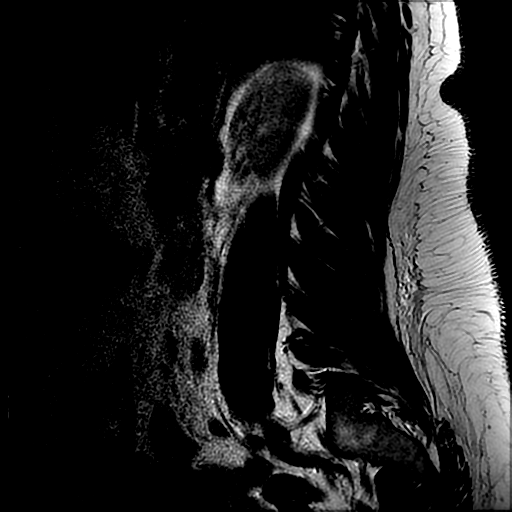
[im 5/15]
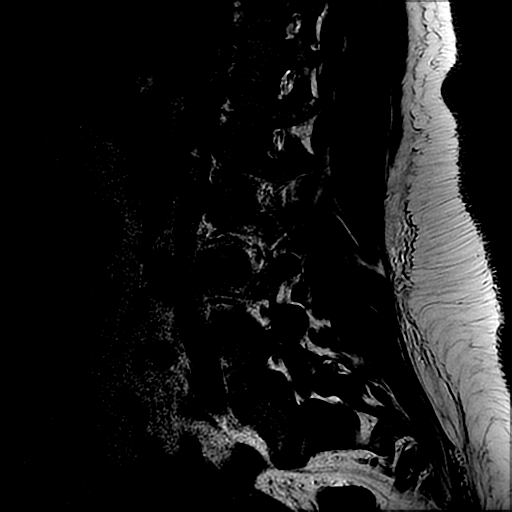
[im 10/15]
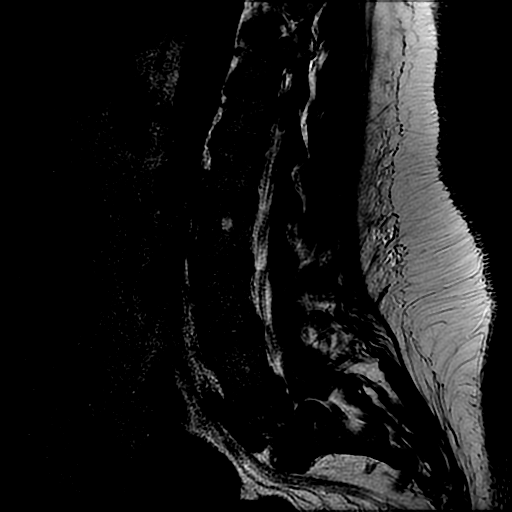
[im 15/15]
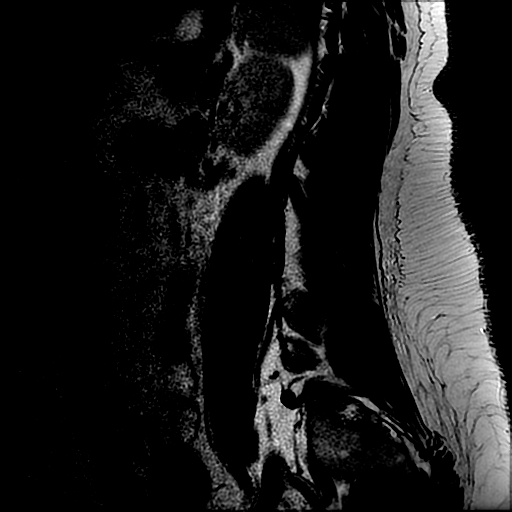

[Series 5: T1 · sagittal · 4.0mm · 0.51mm/px · 3 of 15 slices shown (1 of 2)]
[im 1/15]
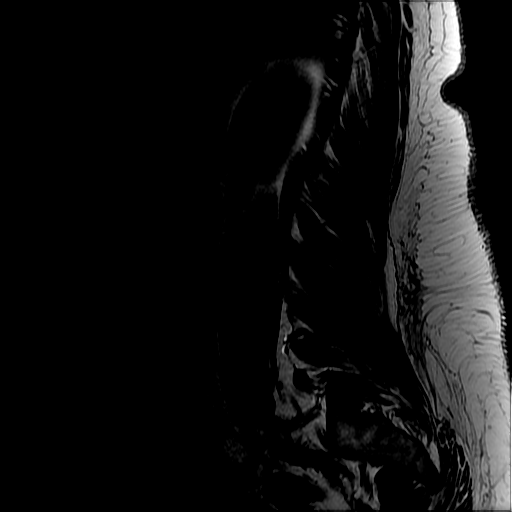
[im 10/15]
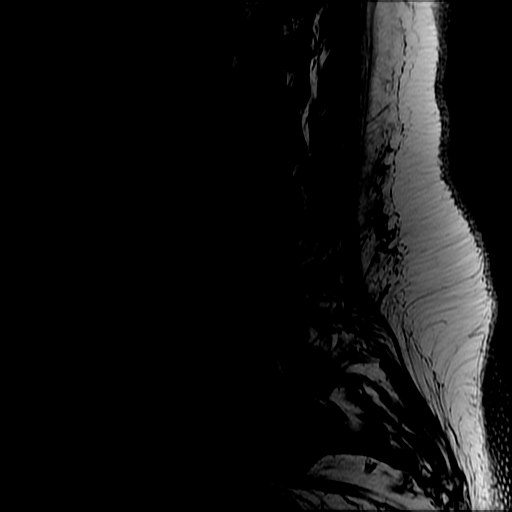
[im 15/15]
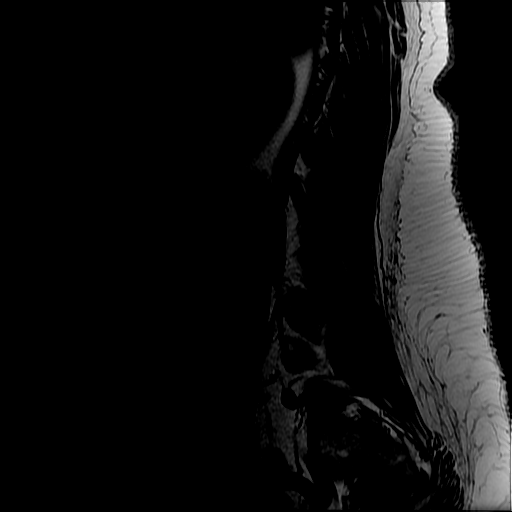

[Series 7: T2 · axial · 4.0mm · 0.39mm/px · z∈[-118,+60]mm · 8 of 33 slices shown (2 of 2)]
[im 1/33]
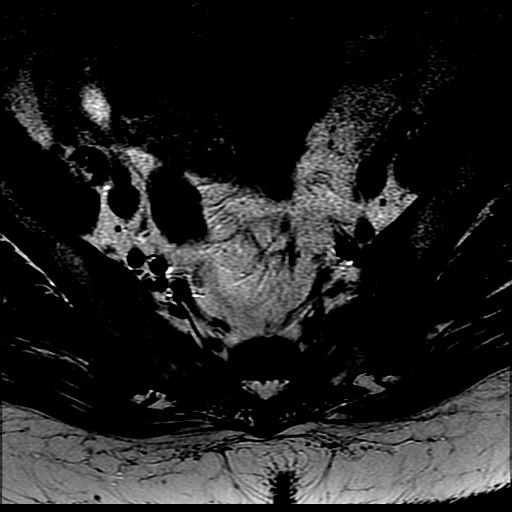
[im 4/33]
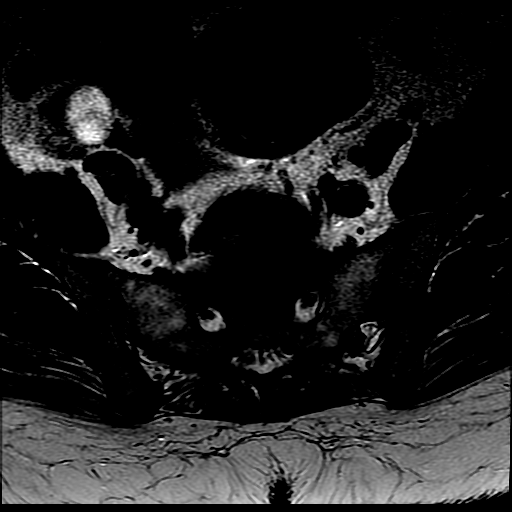
[im 11/33]
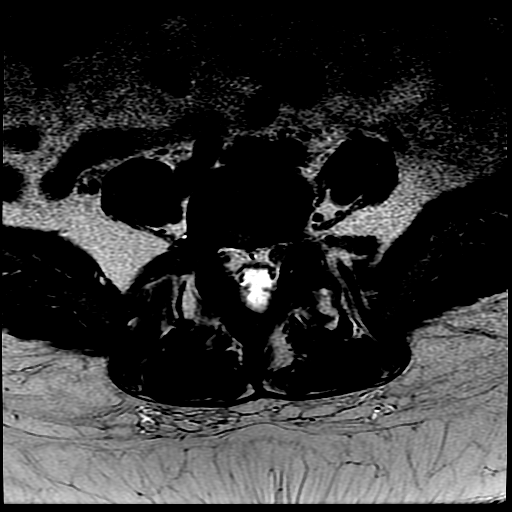
[im 15/33]
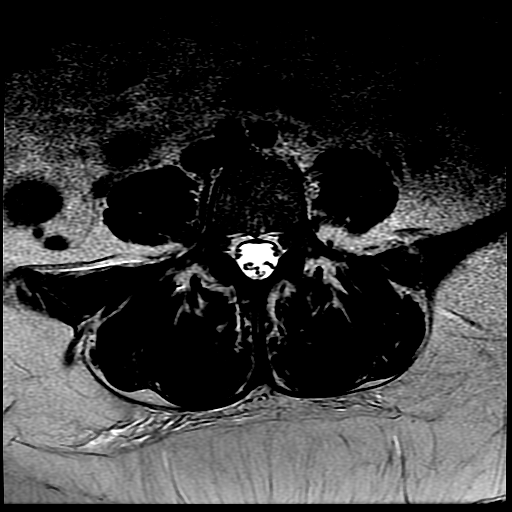
[im 18/33]
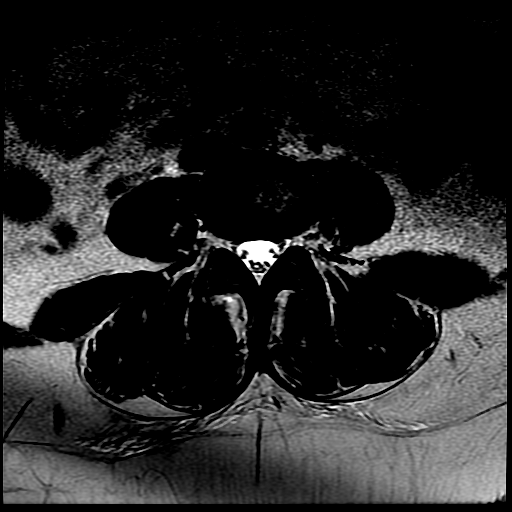
[im 22/33]
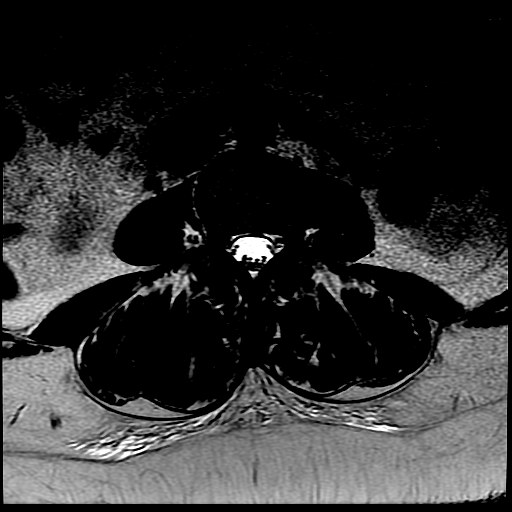
[im 29/33]
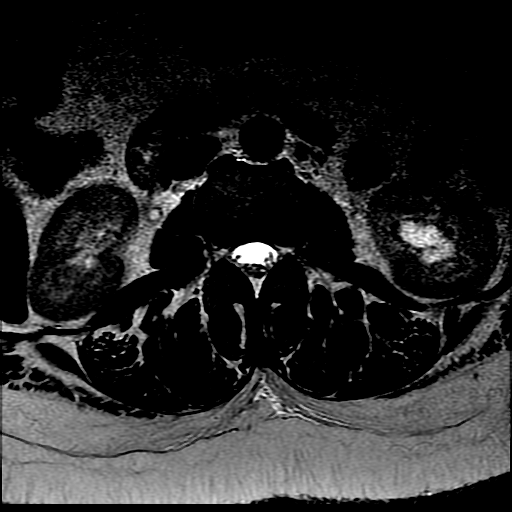
[im 33/33]
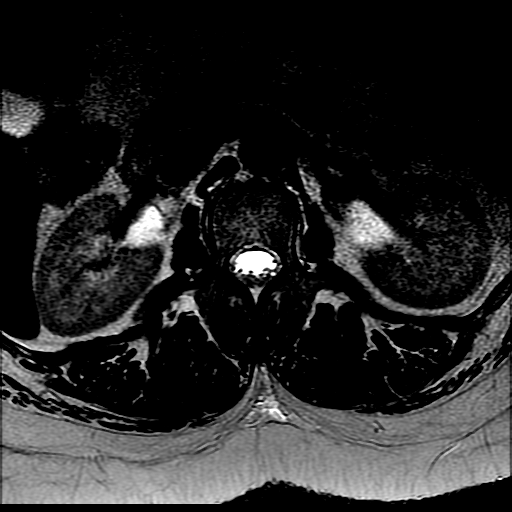

[Series 8: T1 · axial · 4.0mm · 0.39mm/px · z∈[-103,+40]mm · 3 of 33 slices shown (2 of 2)]
[im 4/33]
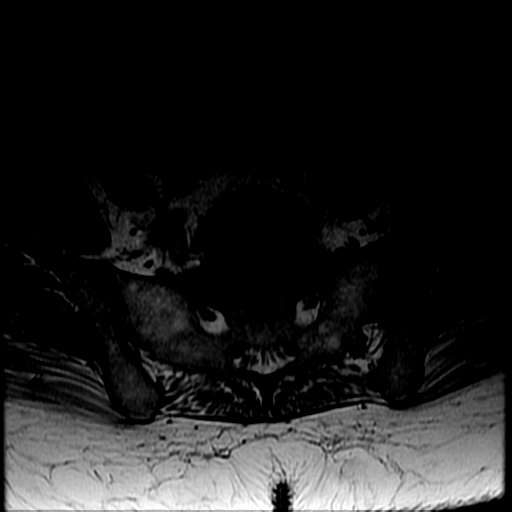
[im 18/33]
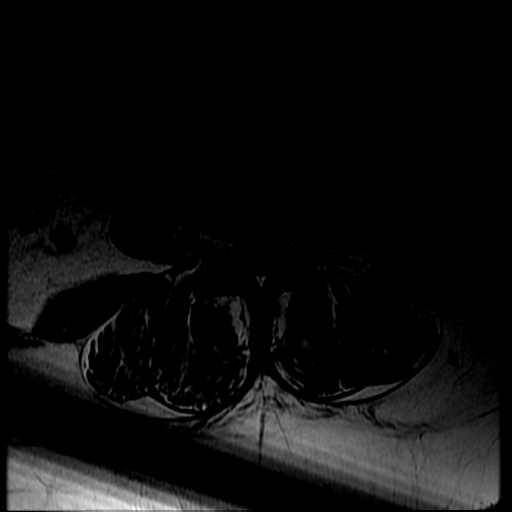
[im 29/33]
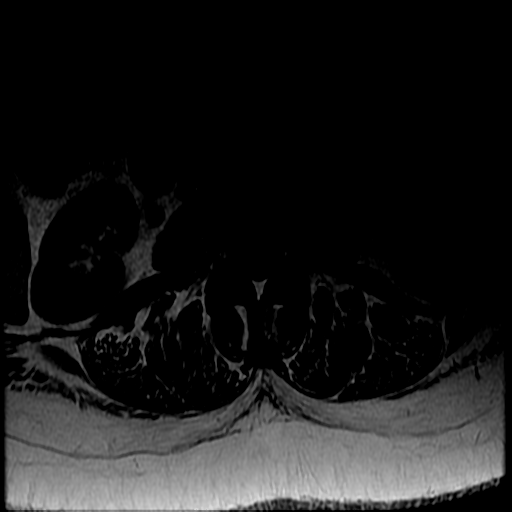

[18 of 48 positions shown; findings below may reference images not displayed]

FINDINGS: Segmentation: Standard. Lowest well-formed disc labeled the L5-S1
level.

Alignment: Chronic bilateral pars defects at L5 with associated 8 mm
spondylolisthesis. Alignment otherwise normal with preservation of
the normal lumbar lordosis.

Vertebrae: Vertebral body height maintained without evidence for
acute or chronic fracture. Underlying bone marrow signal intensity
mildly decreased on T1 weighted imaging, nonspecific, but most
commonly related to anemia, smoking, or obesity. Few scattered
subcentimeter benign hemangiomata noted. No other discrete or
worrisome osseous lesions. Mild reactive endplate changes noted
about the L5-S1 interspace. No other abnormal marrow edema or
enhancement.

Conus medullaris and cauda equina: Conus extends to the L1 level.
Conus and cauda equina appear normal.

Paraspinal and other soft tissues: Paraspinous soft tissues within
normal limits. Enlarged fibroid uterus partially visualized.
Parapelvic cyst noted within the left kidney. Visualized visceral
structures otherwise unremarkable.

Disc levels:

L1-2:  Unremarkable.

L2-3:  Unremarkable.

L3-4:  Unremarkable.

L4-5: Disc desiccation. Shallow broad-based central disc protrusion
closely approximates the descending L5 nerve roots bilaterally
without frank neural impingement or displacement. Superimposed mild
facet and ligament flavum hypertrophy. Resultant mild bilateral
lateral recess stenosis. Central canal remains patent. No foraminal
narrowing.

L5-S1: Chronic bilateral pars defects with associated 8 mm
spondylolisthesis. Associated broad posterior pseudo disc
bulge/uncovering. Mild to moderate facet hypertrophy. No canal or
lateral recess stenosis. Moderate left with mild right L5 foraminal
narrowing.
IMPRESSION: 1. Chronic bilateral pars defects at L5 with associated 8 mm
spondylolisthesis, with resultant moderate left and mild right L5
foraminal stenosis.
2. Shallow central disc protrusion at L4-5, closely approximating
and potentially affecting either of the descending L5 nerve roots in
the lateral recesses.
3. Enlarged fibroid uterus, partially visualized.

## 2021-08-30 NOTE — Telephone Encounter (Signed)
Will you please take care of this?  I've already sent Freestyle Libre 2, can only find Freestyle [place] 3 sensors, not device.   Apparently my original order didn't go through?

## 2021-08-30 NOTE — Telephone Encounter (Signed)
Have called verbal into pharmacy. No further action needed at this time.

## 2021-09-25 ENCOUNTER — Other Ambulatory Visit: Payer: Self-pay

## 2021-09-25 DIAGNOSIS — N951 Menopausal and female climacteric states: Secondary | ICD-10-CM

## 2021-10-31 ENCOUNTER — Telehealth: Payer: Self-pay

## 2021-10-31 NOTE — Telephone Encounter (Signed)
PA needed on Freestyle libre 3 sensors

## 2021-11-01 NOTE — Telephone Encounter (Signed)
Started Madison Herrera looks like the [employer] 2 does not need auth. That also looks like what was last prescribed for patient. Will need to call patient and see if she needs to change or not.

## 2021-11-04 NOTE — Telephone Encounter (Signed)
Voicemail not set up.

## 2021-11-05 NOTE — Telephone Encounter (Signed)
Left message to return call to our office.  

## 2021-11-07 NOTE — Telephone Encounter (Signed)
Noted. It looks like I sent Freestyle 2 in October.

## 2021-11-07 NOTE — Telephone Encounter (Signed)
Called patient x 3 voice mail not set up. Sending my chart to have her call office.

## 2021-11-25 ENCOUNTER — Other Ambulatory Visit: Payer: Self-pay | Admitting: Primary Care

## 2021-11-25 DIAGNOSIS — N951 Menopausal and female climacteric states: Secondary | ICD-10-CM

## 2021-11-27 ENCOUNTER — Other Ambulatory Visit: Payer: Self-pay | Admitting: Neurosurgery

## 2021-12-04 NOTE — Progress Notes (Signed)
Surgical Instructions    Your procedure is scheduled on Monday, January 23rd, 2023.   Report to [facility] Main Entrance "A" at 08:40 A.M., then check in with the Admitting office.  Call this number if you have problems the morning of surgery:  [phone]   If you have any questions prior to your surgery date call [phone]: Open Monday-Friday 8am-4pm    Remember:  Do not eat or drink after midnight the night before your surgery     Take these medicines the morning of surgery with A SIP OF WATER:  atorvastatin (LIPITOR) cyclobenzaprine (FLEXERIL) fluticasone (FLONASE) loratadine (CLARITIN) PARoxetine (PAXIL)  gabapentin (NEURONTIN) - if needed  As of today, STOP taking any Aspirin (unless otherwise instructed by your surgeon) Aleve, Naproxen, Ibuprofen, Motrin, Advil, Goody's, BC's, all herbal medications, fish oil, and all vitamins.   WHAT DO I DO ABOUT MY DIABETES MEDICATION?   Do not take metFORMIN (GLUCOPHAGE XR) the morning of surgery.   HOW TO MANAGE YOUR DIABETES BEFORE AND AFTER SURGERY  Why is it important to control my blood sugar before and after surgery? Improving blood sugar levels before and after surgery helps healing and can limit problems. A way of improving blood sugar control is eating a healthy diet by:  Eating less sugar and carbohydrates  Increasing activity/exercise  Talking with your doctor about reaching your blood sugar goals High blood sugars (greater than 180 mg/dL) can raise your risk of infections and slow your recovery, so you will need to focus on controlling your diabetes during the weeks before surgery. Make sure that the doctor who takes care of your diabetes knows about your planned surgery including the date and location.  How do I manage my blood sugar before surgery? Check your blood sugar at least 4 times a day, starting 2 days before surgery, to make sure that the level is not too high or low.  Check your blood sugar the  morning of your surgery when you wake up and every 2 hours until you get to the Short Stay unit.  If your blood sugar is less than 70 mg/dL, you will need to treat for low blood sugar: Do not take insulin. Treat a low blood sugar (less than 70 mg/dL) with  cup of clear juice (cranberry or apple), 4 glucose tablets, OR glucose gel. Recheck blood sugar in 15 minutes after treatment (to make sure it is greater than 70 mg/dL). If your blood sugar is not greater than 70 mg/dL on recheck, call [phone] for further instructions. Report your blood sugar to the short stay nurse when you get to Short Stay.  If you are admitted to the hospital after surgery: Your blood sugar will be checked by the staff and you will probably be given insulin after surgery (instead of oral diabetes medicines) to make sure you have good blood sugar levels. The goal for blood sugar control after surgery is 80-180 mg/dL.   After your COVID test   You are not required to quarantine however you are required to wear a well-fitting mask when you are out and around people not in your household.  If your mask becomes wet or soiled, replace with a new one.  Wash your hands often with soap and water for 20 seconds or clean your hands with an alcohol-based hand sanitizer that contains at least 60% alcohol.  Do not share personal items.  Notify your provider: if you are in close contact with someone who has COVID  or  if you develop a fever of 100.4 or greater, sneezing, cough, sore throat, shortness of breath or body aches.           Do not wear jewelry or makeup Do not wear lotions, powders, perfumes/colognes, or deodorant. Do not shave 48 hours prior to surgery.  Men may shave face and neck. Do not bring valuables to the hospital. DO Not wear nail polish, gel polish, artificial nails, or any other type of covering on natural nails (fingers and toes) If you have artificial nails or gel coating that need to be removed by a  nail salon, please have this removed prior to surgery. Artificial nails or gel coating may interfere with anesthesia's ability to adequately monitor your vital signs.             [facility] is not responsible for any belongings or valuables.  Do NOT Smoke (Tobacco/Vaping)  24 hours prior to your procedure  If you use a CPAP at night, you may bring your mask for your overnight stay.   Contacts, glasses, hearing aids, dentures or partials may not be worn into surgery, please bring cases for these belongings   For patients admitted to the hospital, discharge time will be determined by your treatment team.   Patients discharged the day of surgery will not be allowed to drive home, and someone needs to stay with them for 24 hours.  NO VISITORS WILL BE ALLOWED IN PRE-OP WHERE PATIENTS ARE PREPPED FOR SURGERY.  ONLY 1 SUPPORT PERSON MAY BE PRESENT IN THE WAITING ROOM WHILE YOU ARE IN SURGERY.  IF YOU ARE TO BE ADMITTED, ONCE YOU ARE IN YOUR ROOM YOU WILL BE ALLOWED TWO (2) VISITORS. 1 (ONE) VISITOR MAY STAY OVERNIGHT BUT MUST ARRIVE TO THE ROOM BY 8pm.  Minor children may have two parents present. Special consideration for safety and communication needs will be reviewed on a case by case basis.  Special instructions:    Oral Hygiene is also important to reduce your risk of infection.  Remember - BRUSH YOUR TEETH THE MORNING OF SURGERY WITH YOUR REGULAR TOOTHPASTE   [place]- Preparing For Surgery  Before surgery, you can play an important role. Because skin is not sterile, your skin needs to be as free of germs as possible. You can reduce the number of germs on your skin by washing with CHG (chlorahexidine gluconate) Soap before surgery.  CHG is an antiseptic cleaner which kills germs and bonds with the skin to continue killing germs even after washing.     Please do not use if you have an allergy to CHG or antibacterial soaps. If your skin becomes reddened/irritated stop using the CHG.  Do  not shave (including legs and underarms) for at least 48 hours prior to first CHG shower. It is OK to shave your face.  Please follow these instructions carefully.     Shower the NIGHT BEFORE SURGERY and the MORNING OF SURGERY with CHG Soap.   If you chose to wash your hair, wash your hair first as usual with your normal shampoo. After you shampoo, rinse your hair and body thoroughly to remove the shampoo.  Then [employer] and genitals (private parts) with your normal soap and rinse thoroughly to remove soap.  After that Use CHG Soap as you would any other liquid soap. You can apply CHG directly to the skin and wash gently with a scrungie or a clean washcloth.   Apply the CHG Soap to your body ONLY  FROM THE NECK DOWN.  Do not use on open wounds or open sores. Avoid contact with your eyes, ears, mouth and genitals (private parts). Wash Face and genitals (private parts)  with your normal soap.   Wash thoroughly, paying special attention to the area where your surgery will be performed.  Thoroughly rinse your body with warm water from the neck down.  DO NOT shower/wash with your normal soap after using and rinsing off the CHG Soap.  Pat yourself dry with a CLEAN TOWEL.  Wear CLEAN PAJAMAS to bed the night before surgery  Place CLEAN SHEETS on your bed the night before your surgery  DO NOT SLEEP WITH PETS.   Day of Surgery:  Take a shower with CHG soap. Wear Clean/Comfortable clothing the morning of surgery Do not apply any deodorants/lotions.   Remember to brush your teeth WITH YOUR REGULAR TOOTHPASTE.   Please read over the following fact sheets that you were given.

## 2021-12-05 ENCOUNTER — Encounter (HOSPITAL_COMMUNITY): Payer: Self-pay

## 2021-12-05 ENCOUNTER — Other Ambulatory Visit: Payer: Self-pay

## 2021-12-05 ENCOUNTER — Encounter (HOSPITAL_COMMUNITY)
Admission: RE | Admit: 2021-12-05 | Discharge: 2021-12-05 | Disposition: A | Discharge status: Home / Self Care | Payer: Commercial Managed Care - HMO | Source: Ambulatory Visit | Attending: Neurosurgery | Admitting: Neurosurgery

## 2021-12-05 VITALS — BP 133/85 | HR 99 | Temp 98.3°F | Resp 17 | Ht 62.0 in | Wt 163.8 lb

## 2021-12-05 DIAGNOSIS — Z87891 Personal history of nicotine dependence: Secondary | ICD-10-CM | POA: Diagnosis not present

## 2021-12-05 DIAGNOSIS — R296 Repeated falls: Secondary | ICD-10-CM | POA: Diagnosis not present

## 2021-12-05 DIAGNOSIS — E119 Type 2 diabetes mellitus without complications: Secondary | ICD-10-CM

## 2021-12-05 DIAGNOSIS — Z01818 Encounter for other preprocedural examination: Secondary | ICD-10-CM | POA: Diagnosis present

## 2021-12-05 DIAGNOSIS — Z20822 Contact with and (suspected) exposure to covid-19: Secondary | ICD-10-CM | POA: Diagnosis not present

## 2021-12-05 DIAGNOSIS — E1140 Type 2 diabetes mellitus with diabetic neuropathy, unspecified: Secondary | ICD-10-CM | POA: Insufficient documentation

## 2021-12-05 DIAGNOSIS — M4310 Spondylolisthesis, site unspecified: Secondary | ICD-10-CM | POA: Diagnosis not present

## 2021-12-05 LAB — CBC WITH DIFFERENTIAL/PLATELET
Abs Immature Granulocytes: 0.02 10*3/uL (ref 0.00–0.07)
Basophils Absolute: 0 10*3/uL (ref 0.0–0.1)
Basophils Relative: 0 %
Eosinophils Absolute: 0 10*3/uL (ref 0.0–0.5)
Eosinophils Relative: 0 %
HCT: 45.6 % (ref 36.0–46.0)
Hemoglobin: 15.1 g/dL — ABNORMAL HIGH (ref 12.0–15.0)
Immature Granulocytes: 0 %
Lymphocytes Relative: 30 %
Lymphs Abs: 2.6 10*3/uL (ref 0.7–4.0)
MCH: 31.8 pg (ref 26.0–34.0)
MCHC: 33.1 g/dL (ref 30.0–36.0)
MCV: 96 fL (ref 80.0–100.0)
Monocytes Absolute: 0.5 10*3/uL (ref 0.1–1.0)
Monocytes Relative: 6 %
Neutro Abs: 5.7 10*3/uL (ref 1.7–7.7)
Neutrophils Relative %: 64 %
Platelets: 292 10*3/uL (ref 150–400)
RBC: 4.75 MIL/uL (ref 3.87–5.11)
RDW: 12.1 % (ref 11.5–15.5)
WBC: 8.9 10*3/uL (ref 4.0–10.5)
nRBC: 0 % (ref 0.0–0.2)

## 2021-12-05 LAB — BASIC METABOLIC PANEL
Anion gap: 11 (ref 5–15)
BUN: 9 mg/dL (ref 6–20)
CO2: 24 mmol/L (ref 22–32)
Calcium: 9.7 mg/dL (ref 8.9–10.3)
Chloride: 103 mmol/L (ref 98–111)
Creatinine, Ser: 0.65 mg/dL (ref 0.44–1.00)
GFR, Estimated: >60 mL/min (ref >60)
Glucose, Bld: 149 mg/dL — ABNORMAL HIGH (ref 70–99)
Potassium: 2.9 mmol/L — ABNORMAL LOW (ref 3.5–5.1)
Sodium: 138 mmol/L (ref 135–145)

## 2021-12-05 LAB — GLUCOSE, CAPILLARY: Glucose-Capillary: 125 mg/dL — ABNORMAL HIGH (ref 70–99)

## 2021-12-05 LAB — HEMOGLOBIN A1C
Hgb A1c MFr Bld: 5.7 % — ABNORMAL HIGH (ref 4.8–5.6)
Mean Plasma Glucose: 116.89 mg/dL

## 2021-12-05 LAB — SURGICAL PCR SCREEN
MRSA, PCR: NEGATIVE
Staphylococcus aureus: NEGATIVE

## 2021-12-05 LAB — TYPE AND SCREEN
ABO/RH(D): A POS
Antibody Screen: NEGATIVE

## 2021-12-05 LAB — SARS CORONAVIRUS 2 (TAT 6-24 HRS): SARS Coronavirus 2: NEGATIVE

## 2021-12-05 NOTE — Progress Notes (Signed)
PCP - Alma Friendly, NP Cardiologist - denies  PPM/ICD - denies   Chest x-ray - 11/14/2003 EKG - 12/05/21 at PAT Stress Test - denies ECHO - denies Cardiac Cath - denies  Sleep Study - denies  DM- Type 2 Fasting Blood Sugar - 90-100 Checks Blood Sugar 3 times a day  Blood Thinner Instructions: n/a Aspirin Instructions: n/a  ERAS Protcol - no, NPO   COVID TEST- 12/05/21 at PAT   Anesthesia review: no  Patient denies shortness of breath, fever, cough and chest pain at PAT appointment   All instructions explained to the patient, with a verbal understanding of the material. Patient agrees to go over the instructions while at home for a better understanding. Patient also instructed to wear a mask in public after being tested for COVID-19. The opportunity to ask questions was provided.

## 2021-12-06 ENCOUNTER — Telehealth: Payer: Self-pay | Admitting: Primary Care

## 2021-12-06 DIAGNOSIS — E876 Hypokalemia: Secondary | ICD-10-CM

## 2021-12-06 MED ORDER — POTASSIUM CHLORIDE CRYS ER 20 MEQ PO TBCR: 20.0000 meq | EXTENDED_RELEASE_TABLET | Freq: Every day | ORAL | 0 refills | Status: DC

## 2021-12-06 NOTE — Telephone Encounter (Signed)
Madison Herrera at [facility] [phone] ext. [phone]  Patient is scheduled for surgery on Monday 1.23.23  Potassium level is 2.9  [facility] is requesting that the potassium level be addressed prior to surgery on Monday, they are faxing information over to [place] regarding this patient

## 2021-12-06 NOTE — Anesthesia Preprocedure Evaluation (Addendum)
Anesthesia Evaluation  Patient identified by MRN, date of birth, ID band Patient awake    Airway Mallampati: II  TM Distance: >3 FB     Dental   Pulmonary Patient abstained from smoking., former smoker,    breath sounds clear to auscultation       Cardiovascular negative cardio ROS   Rhythm:Regular Rate:Normal     Neuro/Psych  Neuromuscular disease    GI/Hepatic negative GI ROS, Neg liver ROS,   Endo/Other  diabetes  Renal/GU negative Renal ROS     Musculoskeletal  (+) Arthritis ,   Abdominal   Peds  Hematology   Anesthesia Other Findings   Reproductive/Obstetrics                            Anesthesia Physical Anesthesia Plan  ASA: 3  Anesthesia Plan: General   Post-op Pain Management:    Induction: Intravenous  PONV Risk Score and Plan: 3 and Ondansetron, Dexamethasone and Midazolam  Airway Management Planned: Oral ETT  Additional Equipment:   Intra-op Plan:   Post-operative Plan: Extubation in OR  Informed Consent: I have reviewed the patients History and Physical, chart, labs and discussed the procedure including the risks, benefits and alternatives for the proposed anesthesia with the patient or authorized representative who has indicated his/her understanding and acceptance.     Dental advisory given  Plan Discussed with: CRNA and Anesthesiologist  Anesthesia Plan Comments: (PAT note written 12/06/2021 by Myra Gianotti, PA-C. )       Anesthesia Quick Evaluation

## 2021-12-06 NOTE — Telephone Encounter (Signed)
Left 2nd VM requesting pt to call the office back  

## 2021-12-06 NOTE — Telephone Encounter (Signed)
Called surgeon and relayed Kate's recommendations. They agree to recheck K on Monday morning and then decide from there.   Called pt and no answer so left VM requesting pt to call the office back.

## 2021-12-06 NOTE — Progress Notes (Signed)
Anesthesia Chart Review:  Case: [id number] Date/Time: 12/09/21 1027   Procedure: PLIF - L4-L5 - L5-S1 (Back)   Anesthesia type: General   Pre-op diagnosis: Spondylolisthesis   Location: [facility] OR ROOM 11 / [facility]   Surgeons: Earnie Larsson, MD       DISCUSSION: Herrera is a 51 year old female scheduled for Madison above procedure.   History includes former smoker (quit 11/17/08), DM2, neuropathy, menorrhagia (s/p D&C, hydrothermal ablation 08/19/13).  A1c 5.7%. K 2.9.  12/05/21 presurgical COVID-19 test negative.  Lorriane Shire at Dr. Marchelle Folks office notified of K 2.9. I also notified Herrera and reviewed some potassium-rich foods. She had already spoken with Lorriane Shire, and to her understanding they were reaching out to her PCP for recommendations.  Will check ISTAT on Madison day of surgery to re-evaluated potassium.  12/05/21 EKG showed NSR, inferolateral T wave abnormality. Interpreting cardiologist felt changes more evident when compared to previous EKG in [place] from 02/23/03. Herrera does have well-controlled DM2, but otherwise no known CAD, HTN and non-smoker for > 10 years. Reviewed with anesthesiologist Nolon Nations, MD. If asymptomatic from CV standpoint and recent reasonable exercise tolerance, then would not anticipate additional  reoperative testing. I called and spoke with her. She denied known cardiac history or HTN. She denied chest pain, SOB, edema, orthopnea. She has been significantly limited in her activity for Madison past three months do her her back issues. She cannot stand for very long and is having frequent falls. She is voiding, but feels like she has less control. Reported that she had discussed these symptoms with Dr. Annette Stable. Prior to Madison worsening back symptoms a few months ago, her activity was not limited. She was able to clean her house including vacuuming, sweeping. She was able to go up flights of stairs in her house without issues, but now has to rest on landing between stairs due to her severe back  pain.  She denied any known personal or family history of anesthesia complications.  Anesthesia team to evaluate on Madison day of surgery. 3-6 months ago reported exercise tolerance of at least 4 METS with no CV symptoms.  Now very limited by her back pain. COVID test negative 12/05/20. For repeat K on arrival, as well as 2nd T&S sample. Pregnancy test as indicated (perimenopausal by documentation).    VS: BP 133/85    Pulse 99    Temp 36.8 C (Oral)    Resp 17    Ht 5\' 2"  (1.575 m)    Wt 74.3 kg    LMP 10/06/2021 (Approximate) Comment: has had ablation   SpO2 100%    BMI 29.96 kg/m    PROVIDERS: [name], NP is PCP. Last visit 08/15/21 for follow-up on chronic medical conditions. Herrera was still considering back surgery at that time.    LABS: See DISCUSSION. (all labs ordered are listed, but only abnormal results are displayed)  Labs Reviewed  GLUCOSE, CAPILLARY - Abnormal; Notable for Madison following components:      Result Value   Glucose-Capillary 125 (*)    All other components within normal limits  CBC WITH DIFFERENTIAL/PLATELET - Abnormal; Notable for Madison following components:   Hemoglobin 15.1 (*)    All other components within normal limits  HEMOGLOBIN A1C - Abnormal; Notable for Madison following components:   Hgb A1c MFr Bld 5.7 (*)    All other components within normal limits  BASIC METABOLIC PANEL - Abnormal; Notable for Madison following components:   Potassium 2.9 (*)  Glucose, Bld 149 (*)    All other components within normal limits  SARS CORONAVIRUS 2 (TAT 6-24 HRS)  SURGICAL PCR SCREEN  TYPE AND SCREEN     IMAGES: MRI L-spine 08/17/19: IMPRESSION: 1. 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 2. Shallow central disc protrusion at L4-5, closely approximating and potentially affecting either of Madison descending L5 nerve roots in Madison lateral recesses. 3.  Enlarged fibroid uterus, partially visualized.    EKG: EKG 12/05/21: Normal sinus rhythm T wave abnormality, consider inferolateral ischemia Abnormal ECG When compared with ECG of 23-Feb-2003 18:26, T wave inversion more evident in Inferolateral leads Confirmed by Martinique, Peter [id number]) on 12/05/2021 2:14:49 PM   CV: N/A  Past Medical History:  Diagnosis Date   Allergic rhinitis    Allergy    Neuromuscular disorder (HCC)    hand and feet -  tx with neurotin   Neuropathy    Type 2 diabetes mellitus ([place])     Past Surgical History:  Procedure Laterality Date   BTL  1995   CESAREAN SECTION     x 2 (1992 and 1995)   DILITATION & CURRETTAGE/HYSTROSCOPY WITH HYDROTHERMAL ABLATION N/A 08/19/2013   Procedure: DILATATION & CURETTAGE/HYSTEROSCOPY WITH HYDROTHERMAL ABLATION;  Surgeon: Shelly Bombard, MD;  Location: [facility];  Service: Gynecology;  Laterality: N/A;   TONSILLECTOMY AND ADENOIDECTOMY     age 10    MEDICATIONS:  atorvastatin (LIPITOR) 20 MG tablet   Continuous Blood Gluc Receiver (FREESTYLE LIBRE 2 READER) DEVI   Continuous Blood Gluc Sensor (FREESTYLE LIBRE 2 SENSOR) MISC   cyclobenzaprine (FLEXERIL) 10 MG tablet   fexofenadine (ALLEGRA) 60 MG tablet   fluticasone (FLONASE) 50 MCG/ACT nasal spray   gabapentin (NEURONTIN) 300 MG capsule   loratadine (CLARITIN) 10 MG tablet   metFORMIN (GLUCOPHAGE XR) 500 MG 24 hr tablet   methocarbamol (ROBAXIN) 500 MG tablet   mirtazapine (REMERON) 15 MG tablet   Multiple Vitamin (MULTIVITAMIN WITH MINERALS) TABS tablet   PARoxetine (PAXIL) 20 MG tablet   No current facility-administered medications for this encounter.    Myra Gianotti, PA-C Surgical Short Stay/Anesthesiology [facility] Phone [phone] [facility] Phone [phone] 12/06/2021 1:13 PM

## 2021-12-06 NOTE — Telephone Encounter (Signed)
Here are my recommendations:  We will treat patient with potassium chloride 20 mEq once daily today, tomorrow, and Sunday. I will send this to her pharmacy.   The surgery center will need to check a STAT potassium level Monday (01/23) and then determine if they would to proceed with surgery. This will have to be their call to make.   Please call surgery center to notify. Also call patient and notify of the prescription and that she will need to follow up with the surgery center for repeat potassium level Monday.

## 2021-12-09 ENCOUNTER — Other Ambulatory Visit: Payer: Self-pay

## 2021-12-09 ENCOUNTER — Encounter (HOSPITAL_COMMUNITY): Payer: Self-pay | Admitting: Neurosurgery

## 2021-12-09 ENCOUNTER — Ambulatory Visit (HOSPITAL_COMMUNITY)
Admission: RE | Disposition: A | Discharge status: Home / Self Care | Payer: Self-pay | Source: Ambulatory Visit | Attending: Neurosurgery

## 2021-12-09 ENCOUNTER — Ambulatory Visit (HOSPITAL_COMMUNITY): Payer: Managed Care, Other (non HMO) | Admitting: Vascular Surgery

## 2021-12-09 ENCOUNTER — Observation Stay (HOSPITAL_COMMUNITY)
Admission: RE | Admit: 2021-12-09 | Discharge: 2021-12-10 | Disposition: A | Discharge status: Home / Self Care | Payer: Managed Care, Other (non HMO) | Source: Ambulatory Visit | Attending: Neurosurgery | Admitting: Neurosurgery

## 2021-12-09 ENCOUNTER — Ambulatory Visit (HOSPITAL_COMMUNITY): Payer: Managed Care, Other (non HMO)

## 2021-12-09 DIAGNOSIS — Z801 Family history of malignant neoplasm of trachea, bronchus and lung: Secondary | ICD-10-CM | POA: Diagnosis not present

## 2021-12-09 DIAGNOSIS — Z800 Family history of malignant neoplasm of digestive organs: Secondary | ICD-10-CM | POA: Diagnosis not present

## 2021-12-09 DIAGNOSIS — Z87891 Personal history of nicotine dependence: Secondary | ICD-10-CM | POA: Diagnosis not present

## 2021-12-09 DIAGNOSIS — M48062 Spinal stenosis, lumbar region with neurogenic claudication: Principal | ICD-10-CM | POA: Insufficient documentation

## 2021-12-09 DIAGNOSIS — M5442 Lumbago with sciatica, left side: Secondary | ICD-10-CM

## 2021-12-09 DIAGNOSIS — E119 Type 2 diabetes mellitus without complications: Secondary | ICD-10-CM | POA: Diagnosis not present

## 2021-12-09 DIAGNOSIS — M4726 Other spondylosis with radiculopathy, lumbar region: Secondary | ICD-10-CM | POA: Insufficient documentation

## 2021-12-09 DIAGNOSIS — Z880 Allergy status to penicillin: Secondary | ICD-10-CM | POA: Diagnosis not present

## 2021-12-09 DIAGNOSIS — Z91040 Latex allergy status: Secondary | ICD-10-CM | POA: Insufficient documentation

## 2021-12-09 DIAGNOSIS — M4317 Spondylolisthesis, lumbosacral region: Secondary | ICD-10-CM

## 2021-12-09 DIAGNOSIS — M7138 Other bursal cyst, other site: Secondary | ICD-10-CM | POA: Diagnosis not present

## 2021-12-09 DIAGNOSIS — Z419 Encounter for procedure for purposes other than remedying health state, unspecified: Secondary | ICD-10-CM

## 2021-12-09 DIAGNOSIS — Z7984 Long term (current) use of oral hypoglycemic drugs: Secondary | ICD-10-CM | POA: Insufficient documentation

## 2021-12-09 LAB — GLUCOSE, CAPILLARY
Glucose-Capillary: 122 mg/dL — ABNORMAL HIGH (ref 70–99)
Glucose-Capillary: 138 mg/dL — ABNORMAL HIGH (ref 70–99)
Glucose-Capillary: 187 mg/dL — ABNORMAL HIGH (ref 70–99)
Glucose-Capillary: 205 mg/dL — ABNORMAL HIGH (ref 70–99)
Glucose-Capillary: 226 mg/dL — ABNORMAL HIGH (ref 70–99)

## 2021-12-09 LAB — POCT PREGNANCY, URINE: Preg Test, Ur: NEGATIVE

## 2021-12-09 LAB — POCT I-STAT, CHEM 8
BUN: 11 mg/dL (ref 6–20)
Calcium, Ion: 1.2 mmol/L (ref 1.15–1.40)
Chloride: 103 mmol/L (ref 98–111)
Creatinine, Ser: 0.5 mg/dL (ref 0.44–1.00)
Glucose, Bld: 112 mg/dL — ABNORMAL HIGH (ref 70–99)
HCT: 48 % — ABNORMAL HIGH (ref 36.0–46.0)
Hemoglobin: 16.3 g/dL — ABNORMAL HIGH (ref 12.0–15.0)
Potassium: 4.7 mmol/L (ref 3.5–5.1)
Sodium: 140 mmol/L (ref 135–145)
TCO2: 30 mmol/L (ref 22–32)

## 2021-12-09 LAB — ABO/RH: ABO/RH(D): A POS

## 2021-12-09 SURGERY — POSTERIOR LUMBAR FUSION 2 LEVEL
Anesthesia: General | Site: Back

## 2021-12-09 MED ORDER — SODIUM CHLORIDE 0.9% FLUSH: 3.0000 mL | Freq: Two times a day (BID) | INTRAVENOUS | Status: DC

## 2021-12-09 MED ORDER — CHLORHEXIDINE GLUCONATE 0.12 % MT SOLN
OROMUCOSAL | Status: AC
  Administered 2021-12-09: 15 mL via OROMUCOSAL
  Filled 2021-12-09: qty 15

## 2021-12-09 MED ORDER — OXYCODONE HCL 5 MG PO TABS
ORAL_TABLET | ORAL | Status: AC
  Filled 2021-12-09: qty 2

## 2021-12-09 MED ORDER — ROCURONIUM BROMIDE 10 MG/ML (PF) SYRINGE
PREFILLED_SYRINGE | INTRAVENOUS | Status: AC
  Filled 2021-12-09: qty 10

## 2021-12-09 MED ORDER — CHLORHEXIDINE GLUCONATE CLOTH 2 % EX PADS: 6.0000 | MEDICATED_PAD | Freq: Once | CUTANEOUS | Status: DC

## 2021-12-09 MED ORDER — BUPIVACAINE HCL (PF) 0.25 % IJ SOLN
INTRAMUSCULAR | Status: DC | PRN
  Administered 2021-12-09: 30 mL

## 2021-12-09 MED ORDER — PROPOFOL 10 MG/ML IV BOLUS
INTRAVENOUS | Status: AC
  Filled 2021-12-09: qty 20

## 2021-12-09 MED ORDER — THROMBIN 20000 UNITS EX SOLR
CUTANEOUS | Status: AC
  Filled 2021-12-09: qty 20000

## 2021-12-09 MED ORDER — ALBUMIN HUMAN 5 % IV SOLN: INTRAVENOUS | Status: DC | PRN

## 2021-12-09 MED ORDER — PAROXETINE HCL 20 MG PO TABS
20.0000 mg | ORAL_TABLET | Freq: Every day | ORAL | Status: DC
  Administered 2021-12-10: 09:00:00 20 mg via ORAL
  Filled 2021-12-09: qty 1

## 2021-12-09 MED ORDER — ROCURONIUM BROMIDE 10 MG/ML (PF) SYRINGE
PREFILLED_SYRINGE | INTRAVENOUS | Status: DC | PRN
  Administered 2021-12-09: 60 mg via INTRAVENOUS
  Administered 2021-12-09 (×3): 20 mg via INTRAVENOUS

## 2021-12-09 MED ORDER — MIRTAZAPINE 15 MG PO TABS
15.0000 mg | ORAL_TABLET | Freq: Every day | ORAL | Status: DC
  Filled 2021-12-09 (×2): qty 1

## 2021-12-09 MED ORDER — FENTANYL CITRATE (PF) 100 MCG/2ML IJ SOLN: 25.0000 ug | INTRAMUSCULAR | Status: DC | PRN

## 2021-12-09 MED ORDER — 0.9 % SODIUM CHLORIDE (POUR BTL) OPTIME
TOPICAL | Status: DC | PRN
  Administered 2021-12-09: 1000 mL

## 2021-12-09 MED ORDER — VANCOMYCIN HCL 1000 MG IV SOLR
INTRAVENOUS | Status: AC
  Filled 2021-12-09: qty 20

## 2021-12-09 MED ORDER — FENTANYL CITRATE (PF) 100 MCG/2ML IJ SOLN
25.0000 ug | INTRAMUSCULAR | Status: DC | PRN
  Administered 2021-12-09 (×3): 50 ug via INTRAVENOUS

## 2021-12-09 MED ORDER — POLYETHYLENE GLYCOL 3350 17 G PO PACK: 17.0000 g | PACK | Freq: Every day | ORAL | Status: DC | PRN

## 2021-12-09 MED ORDER — OXYCODONE HCL 5 MG PO TABS
10.0000 mg | ORAL_TABLET | ORAL | Status: DC | PRN
  Administered 2021-12-09 – 2021-12-10 (×5): 10 mg via ORAL
  Filled 2021-12-09 (×4): qty 2

## 2021-12-09 MED ORDER — ACETAMINOPHEN 650 MG RE SUPP: 650.0000 mg | RECTAL | Status: DC | PRN

## 2021-12-09 MED ORDER — MENTHOL 3 MG MT LOZG
1.0000 | LOZENGE | OROMUCOSAL | Status: DC | PRN
  Filled 2021-12-09: qty 9

## 2021-12-09 MED ORDER — ADULT MULTIVITAMIN W/MINERALS CH
1.0000 | ORAL_TABLET | Freq: Every day | ORAL | Status: DC
  Administered 2021-12-10: 09:00:00 1 via ORAL
  Filled 2021-12-09 (×2): qty 1

## 2021-12-09 MED ORDER — BISACODYL 10 MG RE SUPP: 10.0000 mg | Freq: Every day | RECTAL | Status: DC | PRN

## 2021-12-09 MED ORDER — METFORMIN HCL ER 500 MG PO TB24
500.0000 mg | ORAL_TABLET | Freq: Every day | ORAL | Status: DC
  Administered 2021-12-10: 08:00:00 500 mg via ORAL
  Filled 2021-12-09: qty 1

## 2021-12-09 MED ORDER — LORATADINE 10 MG PO TABS: 10.0000 mg | ORAL_TABLET | Freq: Every day | ORAL | Status: DC | PRN

## 2021-12-09 MED ORDER — FENTANYL CITRATE (PF) 100 MCG/2ML IJ SOLN
INTRAMUSCULAR | Status: DC | PRN
  Administered 2021-12-09: 50 ug via INTRAVENOUS
  Administered 2021-12-09 (×2): 100 ug via INTRAVENOUS

## 2021-12-09 MED ORDER — LACTATED RINGERS IV SOLN: INTRAVENOUS | Status: DC

## 2021-12-09 MED ORDER — SUGAMMADEX SODIUM 200 MG/2ML IV SOLN
INTRAVENOUS | Status: DC | PRN
Start: 2021-12-09 — End: 2021-12-09
  Administered 2021-12-09: 200 mg via INTRAVENOUS

## 2021-12-09 MED ORDER — HYDROCODONE-ACETAMINOPHEN 10-325 MG PO TABS: 1.0000 | ORAL_TABLET | ORAL | Status: DC | PRN

## 2021-12-09 MED ORDER — POTASSIUM CHLORIDE CRYS ER 20 MEQ PO TBCR: 20.0000 meq | EXTENDED_RELEASE_TABLET | Freq: Every day | ORAL | Status: DC

## 2021-12-09 MED ORDER — GABAPENTIN 300 MG PO CAPS
300.0000 mg | ORAL_CAPSULE | Freq: Three times a day (TID) | ORAL | Status: DC
  Administered 2021-12-10: 09:00:00 300 mg via ORAL
  Filled 2021-12-09 (×3): qty 1

## 2021-12-09 MED ORDER — SODIUM CHLORIDE 0.9 % IV SOLN: 250.0000 mL | INTRAVENOUS | Status: DC

## 2021-12-09 MED ORDER — ONDANSETRON HCL 4 MG/2ML IJ SOLN: 4.0000 mg | Freq: Four times a day (QID) | INTRAMUSCULAR | Status: DC | PRN

## 2021-12-09 MED ORDER — PHENYLEPHRINE 40 MCG/ML (10ML) SYRINGE FOR IV PUSH (FOR BLOOD PRESSURE SUPPORT)
PREFILLED_SYRINGE | INTRAVENOUS | Status: AC
  Filled 2021-12-09: qty 10

## 2021-12-09 MED ORDER — ONDANSETRON HCL 4 MG/2ML IJ SOLN
INTRAMUSCULAR | Status: AC
  Filled 2021-12-09: qty 2

## 2021-12-09 MED ORDER — HYDROMORPHONE HCL 1 MG/ML IJ SOLN
1.0000 mg | INTRAMUSCULAR | Status: DC | PRN
  Administered 2021-12-10: 03:00:00 1 mg via INTRAVENOUS
  Filled 2021-12-09: qty 1

## 2021-12-09 MED ORDER — SODIUM CHLORIDE 0.9% FLUSH: 3.0000 mL | INTRAVENOUS | Status: DC | PRN

## 2021-12-09 MED ORDER — FENTANYL CITRATE (PF) 250 MCG/5ML IJ SOLN
INTRAMUSCULAR | Status: AC
  Filled 2021-12-09: qty 5

## 2021-12-09 MED ORDER — LIDOCAINE 2% (20 MG/ML) 5 ML SYRINGE
INTRAMUSCULAR | Status: DC | PRN
  Administered 2021-12-09: 100 mg via INTRAVENOUS

## 2021-12-09 MED ORDER — FENTANYL CITRATE (PF) 100 MCG/2ML IJ SOLN
INTRAMUSCULAR | Status: AC
  Filled 2021-12-09: qty 2

## 2021-12-09 MED ORDER — FLEET ENEMA 7-19 GM/118ML RE ENEM: 1.0000 | ENEMA | Freq: Once | RECTAL | Status: DC | PRN

## 2021-12-09 MED ORDER — INSULIN ASPART 100 UNIT/ML IJ SOLN
0.0000 [IU] | Freq: Three times a day (TID) | INTRAMUSCULAR | Status: DC
  Administered 2021-12-10: 07:00:00 4 [IU] via SUBCUTANEOUS

## 2021-12-09 MED ORDER — VANCOMYCIN HCL 1000 MG IV SOLR
INTRAVENOUS | Status: DC | PRN
  Administered 2021-12-09: 1000 mg via TOPICAL

## 2021-12-09 MED ORDER — BUPIVACAINE HCL (PF) 0.25 % IJ SOLN
INTRAMUSCULAR | Status: AC
  Filled 2021-12-09: qty 30

## 2021-12-09 MED ORDER — THROMBIN 20000 UNITS EX SOLR: CUTANEOUS | Status: DC | PRN

## 2021-12-09 MED ORDER — MIDAZOLAM HCL 2 MG/2ML IJ SOLN
INTRAMUSCULAR | Status: DC | PRN
Start: 2021-12-09 — End: 2021-12-09
  Administered 2021-12-09: 2 mg via INTRAVENOUS

## 2021-12-09 MED ORDER — ORAL CARE MOUTH RINSE: 15.0000 mL | Freq: Once | OROMUCOSAL | Status: AC

## 2021-12-09 MED ORDER — DIAZEPAM 5 MG PO TABS
5.0000 mg | ORAL_TABLET | Freq: Four times a day (QID) | ORAL | Status: DC | PRN
  Administered 2021-12-09 – 2021-12-10 (×3): 5 mg via ORAL
  Filled 2021-12-09 (×3): qty 1

## 2021-12-09 MED ORDER — PHENOL 1.4 % MT LIQD: 1.0000 | OROMUCOSAL | Status: DC | PRN

## 2021-12-09 MED ORDER — DEXAMETHASONE SODIUM PHOSPHATE 10 MG/ML IJ SOLN
INTRAMUSCULAR | Status: AC
  Filled 2021-12-09: qty 1

## 2021-12-09 MED ORDER — MIDAZOLAM HCL 2 MG/2ML IJ SOLN
INTRAMUSCULAR | Status: AC
  Filled 2021-12-09: qty 2

## 2021-12-09 MED ORDER — ACETAMINOPHEN 325 MG PO TABS
650.0000 mg | ORAL_TABLET | ORAL | Status: DC | PRN
  Administered 2021-12-09 – 2021-12-10 (×2): 650 mg via ORAL
  Filled 2021-12-09 (×2): qty 2

## 2021-12-09 MED ORDER — ATORVASTATIN CALCIUM 10 MG PO TABS
20.0000 mg | ORAL_TABLET | Freq: Every day | ORAL | Status: DC
  Administered 2021-12-10: 09:00:00 20 mg via ORAL
  Filled 2021-12-09: qty 2

## 2021-12-09 MED ORDER — DEXAMETHASONE SODIUM PHOSPHATE 10 MG/ML IJ SOLN
INTRAMUSCULAR | Status: DC | PRN
  Administered 2021-12-09: 5 mg via INTRAVENOUS

## 2021-12-09 MED ORDER — PROPOFOL 10 MG/ML IV BOLUS
INTRAVENOUS | Status: DC | PRN
  Administered 2021-12-09: 150 mg via INTRAVENOUS

## 2021-12-09 MED ORDER — LIDOCAINE 2% (20 MG/ML) 5 ML SYRINGE
INTRAMUSCULAR | Status: AC
  Filled 2021-12-09: qty 10

## 2021-12-09 MED ORDER — VANCOMYCIN HCL IN DEXTROSE 1-5 GM/200ML-% IV SOLN
INTRAVENOUS | Status: AC
  Administered 2021-12-09: 1000 mg via INTRAVENOUS
  Filled 2021-12-09: qty 200

## 2021-12-09 MED ORDER — VANCOMYCIN HCL IN DEXTROSE 1-5 GM/200ML-% IV SOLN
1000.0000 mg | Freq: Once | INTRAVENOUS | Status: AC
  Administered 2021-12-09: 1000 mg via INTRAVENOUS
  Filled 2021-12-09: qty 200

## 2021-12-09 MED ORDER — ONDANSETRON HCL 4 MG PO TABS: 4.0000 mg | ORAL_TABLET | Freq: Four times a day (QID) | ORAL | Status: DC | PRN

## 2021-12-09 MED ORDER — VANCOMYCIN HCL IN DEXTROSE 1-5 GM/200ML-% IV SOLN: 1000.0000 mg | INTRAVENOUS | Status: AC

## 2021-12-09 MED ORDER — CHLORHEXIDINE GLUCONATE 0.12 % MT SOLN: 15.0000 mL | Freq: Once | OROMUCOSAL | Status: AC

## 2021-12-09 SURGICAL SUPPLY — 77 items
BAG COUNTER SPONGE SURGICOUNT (BAG) ×2 IMPLANT
BAG DECANTER FOR FLEXI CONT (MISCELLANEOUS) ×1 IMPLANT
BAG SURGICOUNT SPONGE COUNTING (BAG) ×1
BENZOIN TINCTURE PRP APPL 2/3 (GAUZE/BANDAGES/DRESSINGS) ×3 IMPLANT
BLADE CLIPPER SURG (BLADE) IMPLANT
BONE GRAFTON DBF INJECT 6CC (Bone Implant) ×4 IMPLANT
BUR CUTTER 7.0 ROUND (BURR) IMPLANT
BUR MATCHSTICK NEURO 3.0 LAGG (BURR) ×3 IMPLANT
CAGE EXP CATALYFT 9 (Plate) ×4 IMPLANT
CAGE EXP CATALYFT SHORT 9X22.5 (Cage) ×4 IMPLANT
CANISTER SUCT 3000ML PPV (MISCELLANEOUS) ×3 IMPLANT
CAP LCK SPNE (Orthopedic Implant) ×6 IMPLANT
CAP LOCK SPINE RADIUS (Orthopedic Implant) IMPLANT
CAP LOCKING (Orthopedic Implant) ×18 IMPLANT
CARTRIDGE OIL MAESTRO DRILL (MISCELLANEOUS) ×1 IMPLANT
CLOSURE WOUND 1/2 X4 (GAUZE/BANDAGES/DRESSINGS) ×1
CNTNR URN SCR LID CUP LEK RST (MISCELLANEOUS) ×1 IMPLANT
CONT SPEC 4OZ STRL OR WHT (MISCELLANEOUS) ×3
COVER BACK TABLE 60X90IN (DRAPES) ×3 IMPLANT
DECANTER SPIKE VIAL GLASS SM (MISCELLANEOUS) ×1 IMPLANT
DERMABOND ADVANCED (GAUZE/BANDAGES/DRESSINGS) ×2
DERMABOND ADVANCED .7 DNX12 (GAUZE/BANDAGES/DRESSINGS) ×1 IMPLANT
DIFFUSER DRILL AIR PNEUMATIC (MISCELLANEOUS) ×3 IMPLANT
DRAPE C-ARM 42X72 X-RAY (DRAPES) ×8 IMPLANT
DRAPE HALF SHEET 40X57 (DRAPES) IMPLANT
DRAPE LAPAROTOMY 100X72X124 (DRAPES) ×3 IMPLANT
DRAPE SURG 17X23 STRL (DRAPES) ×4 IMPLANT
DRSG OPSITE POSTOP 4X6 (GAUZE/BANDAGES/DRESSINGS) ×1 IMPLANT
DRSG OPSITE POSTOP 4X8 (GAUZE/BANDAGES/DRESSINGS) ×2 IMPLANT
DURAPREP 26ML APPLICATOR (WOUND CARE) ×3 IMPLANT
ELECT REM PT RETURN 9FT ADLT (ELECTROSURGICAL) ×3
ELECTRODE REM PT RTRN 9FT ADLT (ELECTROSURGICAL) ×1 IMPLANT
EVACUATOR 1/8 PVC DRAIN (DRAIN) IMPLANT
GAUZE 4X4 16PLY ~~LOC~~+RFID DBL (SPONGE) ×2 IMPLANT
GAUZE SPONGE 4X4 12PLY STRL (GAUZE/BANDAGES/DRESSINGS) IMPLANT
GLOVE SRG 8 PF TXTR STRL LF DI (GLOVE) IMPLANT
GLOVE SURG ENC MOIS LTX SZ6.5 (GLOVE) ×1 IMPLANT
GLOVE SURG LTX SZ9 (GLOVE) ×2 IMPLANT
GLOVE SURG PI LF SZ9 (GLOVE) ×2
GLOVE SURG POLYISO LF SZ6 (GLOVE) ×2 IMPLANT
GLOVE SURG POLYISO LF SZ6.5 (GLOVE) ×10 IMPLANT
GLOVE SURG POLYISO LF SZ9 (GLOVE) ×2 IMPLANT
GLOVE SURG UNDER POLY LF SZ6.5 (GLOVE) ×3 IMPLANT
GLOVE SURG UNDER POLY LF SZ7.5 (GLOVE) ×8 IMPLANT
GLOVE SURG UNDER POLY LF SZ8 (GLOVE) ×3
GOWN STRL REUS W/ TWL LRG LVL3 (GOWN DISPOSABLE) IMPLANT
GOWN STRL REUS W/ TWL XL LVL3 (GOWN DISPOSABLE) ×2 IMPLANT
GOWN STRL REUS W/TWL 2XL LVL3 (GOWN DISPOSABLE) IMPLANT
GOWN STRL REUS W/TWL LRG LVL3 (GOWN DISPOSABLE) ×15
GOWN STRL REUS W/TWL XL LVL3 (GOWN DISPOSABLE) ×6
KIT BASIN OR (CUSTOM PROCEDURE TRAY) ×3 IMPLANT
KIT TURNOVER KIT B (KITS) ×3 IMPLANT
MILL MEDIUM DISP (BLADE) ×3 IMPLANT
NEEDLE HYPO 22GX1.5 SAFETY (NEEDLE) ×3 IMPLANT
NS IRRIG 1000ML POUR BTL (IV SOLUTION) ×3 IMPLANT
OIL CARTRIDGE MAESTRO DRILL (MISCELLANEOUS) ×3
PACK LAMINECTOMY NEURO (CUSTOM PROCEDURE TRAY) ×3 IMPLANT
PATTIES SURGICAL 1X1 (DISPOSABLE) ×2 IMPLANT
ROD 70MM (Rod) ×3 IMPLANT
ROD MAX 50MM (Rod) ×2 IMPLANT
ROD SPNL 70X5.5 NS TI RDS (Rod) IMPLANT
SCREW 5.75 X 635 (Screw) ×4 IMPLANT
SCREW 5.75X40M (Screw) ×4 IMPLANT
SCREW 5.75X45MM (Screw) ×4 IMPLANT
SPONGE SURGIFOAM ABS GEL 100 (HEMOSTASIS) ×5 IMPLANT
SPONGE T-LAP 4X18 ~~LOC~~+RFID (SPONGE) ×4 IMPLANT
STRIP CLOSURE SKIN 1/2X4 (GAUZE/BANDAGES/DRESSINGS) ×3 IMPLANT
SUT VIC AB 0 CT1 18XCR BRD8 (SUTURE) ×2 IMPLANT
SUT VIC AB 0 CT1 8-18 (SUTURE) ×3
SUT VIC AB 2-0 CT1 18 (SUTURE) ×5 IMPLANT
SUT VIC AB 3-0 SH 8-18 (SUTURE) ×6 IMPLANT
TOWEL GREEN STERILE (TOWEL DISPOSABLE) ×3 IMPLANT
TOWEL GREEN STERILE FF (TOWEL DISPOSABLE) ×3 IMPLANT
TRAY FOL W/BAG SLVR 16FR STRL (SET/KITS/TRAYS/PACK) IMPLANT
TRAY FOLEY MTR SLVR 16FR STAT (SET/KITS/TRAYS/PACK) ×1 IMPLANT
TRAY FOLEY W/BAG SLVR 16FR LF (SET/KITS/TRAYS/PACK) ×3
WATER STERILE IRR 1000ML POUR (IV SOLUTION) ×3 IMPLANT

## 2021-12-09 NOTE — Anesthesia Procedure Notes (Signed)

## 2021-12-09 NOTE — Telephone Encounter (Signed)
I was able to connect with patient via [place] on 12/06/21.  Repeat potassium level today was normal.

## 2021-12-09 NOTE — Progress Notes (Signed)
Pharmacy Antibiotic Note  Madison Herrera is a 51 y.o. 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  Pharmacy has been consulted today 12/09/21 for Vancomycin dosing for surgical prophylaxis.   S/P lumbar decompression and fusion surgery today 12/09/21. She has an allergy to PCN, thus she received Vancomycin 1g preop @ 0951 today/   No drain noted and confirmed with nurse.  CrCl 77 ml/min, renal function normal.   Plan:  give Vanc 1g  IV x1 tonight @2200    Plan: Give Vancomycin 1g  IV x1 tonight @2200  Pharmacy will sign off.  Height: 5\' 2"  (157.5 cm) Weight: 73.9 kg (163 lb) IBW/kg (Calculated) : 50.1  Temp (24hrs), Avg:98.4 F (36.9 C), Min:97.8 F (36.6 C), Max:98.6 F (37 C)  Recent Labs  Lab 12/05/21 1039 12/09/21 0940  WBC 8.9  --   CREATININE 0.65 0.50    Estimated Creatinine Clearance: 77.4 mL/min (by C-G formula based on SCr of 0.5 mg/dL).    Allergies  Allergen Reactions   Latex Rash    fever   Penicillins Rash   Trazodone And Nefazodone Other (See Comments)    Vivid dreams/nightmares    Thank you for allowing pharmacy to be a part of this patients care.  Nicole Cella, RPh Clinical Pharmacist Please check AMION for all [facility] [place] phone numbers After 10:00 PM, call [facility] [phone] 12/09/2021 5:51 PM

## 2021-12-09 NOTE — Op Note (Signed)
Date of procedure: 12/09/2021  Date of dictation: Same  Service: Neurosurgery  Preoperative diagnosis: Right L4-5 facet arthropathy and synovial cyst with radiculopathy  L5-S1 grade 2 lytic spondylolisthesis with severe foraminal stenosis and radiculopathy  Postoperative diagnosis: Same  Procedure Name: Bilateral L4-5 decompressive laminotomies and foraminotomies, more than would be required for simple interbody fusion alone.  L5 Gill procedure with complete L5 laminectomy and facetectomy with L5 and S1 foraminotomies  L4-5, L5-S1 posterior lumbar interbody fusion utilizing interbody cages, locally harvested autograft, and morselized allograft  L4-5 S1 posterior lateral arthrodesis utilizing segmental pedicle screw fixation and local autograft  Surgeon:Gentry Pilson A.Joseline Mccampbell, M.D.  Asst. Surgeon: Venetia Constable, MD; Reinaldo Meeker, NP  Anesthesia: General  Indication: 51 year old female with severe back and right lower extremity pain failing conservative management her work-up demonstrates evidence of a grade 2 L5-S1 lytic spondylolisthesis with severe neuroforaminal stenosis.  Patient also with disc degeneration with associated facet arthropathy and a right-sided small synovial cyst with compression of the right L5 nerve root.  Patient has failed conservative management presents now for two-level lumbar decompression and fusion in hopes of improving her symptoms.  Operative note: After induction of anesthesia, patient position prone onto Wilson frame and properly padded.  Lumbar region prepped and draped sterilely.  Incision made from L4-S1.  Dissection performed bilaterally.  Retractor placed.  Fluoroscopy used.  Levels confirmed.  Complete laminectomy of L5 with inferior facetectomies (Gill procedure) was performed using [employer].  Ligamentum flavum and rudimentary accessory inferior facet of L5 were both resected.  Superior facetectomies of L5 were also performed.   Foraminotomies were completed on the course exiting L5 and S1 nerve roots bilaterally.  Bilateral decompressive laminotomies and facetectomies then performed at L4-5 removing the inferior two thirds lamina of L4 the entire inferior facet and pars interarticularis of L4 and the majority of the superior facet of L5.  Ligament flavum elevated and resected.  Foraminotomies completed along course exiting L4 and L5 nerve roots bilaterally.  Bilateral discectomies then performed at L4-5 and L5-S1.  The space entered and cleaned of all soft tissue.  The space then prepared for interbody fusion.  With a distractor placed the patient's right side to space further cleaned of soft tissue.  On the left side at L4-5 a 9 mm standard [employer]onic expandable cage was then impacted into place and expanded.  Distractor was moved patient's right side.  The space was further prepared on the right side.  Morselized autograft was then packed into the interspace.  Second cage was then impacted in place and expanded.  Procedures then repeated at L5 and S1 with good reduction of her deformity and utilization of small [employer]onic expandable cages as well as local autograft.  Pedicles of L4-L5 and S1 were identified using surface landmarks and intraoperative fluoroscopy of superficial bone overlying the pedicle was then removed with high-speed drill.  Pedicles then probed using a pedicle all each pedicle all track was then probed and found to be solidly within the bone.  Each pedicle tract was then tapped with a screw tap.  Screw temple was probed and found to be solidly within the bone.  5.75 mm radius brand screws from [employer] were placed bilaterally at L4-L5 and S1.  Final images reveal good position of cages and the hardware at the proper operative level with normal alignment of the spine.  The cages were further packed with morselized allograft demineralized bone putty and the transverse processes and sacral ala were decorticated.  Morselized autograft was packed posterior laterally.  Short segment titanium rod placed over the screws at L4-L5 and S1.  Locking caps placed over the screws and locking caps and engaged with a construct under compression.  Gelfoam was placed over the laminectomy defect.  Vancomycin powder was placed in deep wound space.  Wounds and closed in layers with Vicryl sutures.  Steri-Strips and sterile dressing were applied.  No apparent complications.  Patient tolerated the procedure well and she returns to the recovery room postop.

## 2021-12-09 NOTE — Brief Op Note (Signed)
12/09/2021  1:54 PM  PATIENT:  Damian Leavell  51 y.o. female  PRE-OPERATIVE DIAGNOSIS:  Spondylolisthesis  POST-OPERATIVE DIAGNOSIS:  Spondylolisthesis  PROCEDURE:  Procedure(s): Posterior Lumbar Interbody Fusion Lumbar four-five, Lumbar five-Sacral one (N/A)  SURGEON:  Surgeon(s) and Role:    * Earnie Larsson, MD - Primary    * Ostergard, Joyice Faster, MD - Assisting  PHYSICIAN ASSISTANT:   ASSISTANTSMearl Latin   ANESTHESIA:   general  EBL:  350 mL   BLOOD ADMINISTERED:none  DRAINS: none   LOCAL MEDICATIONS USED:  MARCAINE     SPECIMEN:  No Specimen  DISPOSITION OF SPECIMEN:  N/A  COUNTS:  YES  TOURNIQUET:  * No tourniquets in log *  DICTATION: .Dragon Dictation  PLAN OF CARE: Admit for overnight observation  PATIENT DISPOSITION:  PACU - hemodynamically stable.   Delay start of Pharmacological VTE agent (>24hrs) due to surgical blood loss or risk of bleeding: yes

## 2021-12-09 NOTE — Anesthesia Postprocedure Evaluation (Signed)
Anesthesia Post Note  Patient: Madison Herrera  Procedure(s) Performed: Posterior Lumbar Interbody Fusion Lumbar four-five, Lumbar five-Sacral one (Back)     Patient location during evaluation: PACU Anesthesia Type: General Level of consciousness: awake Pain management: pain level controlled Vital Signs Assessment: post-procedure vital signs reviewed and stable Respiratory status: spontaneous breathing Cardiovascular status: stable Postop Assessment: no apparent nausea or vomiting Anesthetic complications: no   No notable events documented.  Last Vitals:  Vitals:   12/09/21 1508 12/09/21 1538  BP: (!) 149/83 115/89  Pulse: (!) 111 (!) 112  Resp: 12 19  Temp: 36.9 C   SpO2: 98% 97%    Last Pain:  Vitals:   12/09/21 1508  TempSrc:   PainSc: 3                  Milisa Kimbell

## 2021-12-09 NOTE — Telephone Encounter (Signed)
Called pt twice Friday and couldn't get in touch with pt. Will route to PCP as an [place]

## 2021-12-09 NOTE — H&P (Signed)
Madison Herrera is an 51 y.o. female.   Chief Complaint: Back and right leg pain HPI: 51 year old female with severe lumbar pain with severe radiation to right lower extremity.  Symptoms aggravated by standing or walking.  Symptoms of failed conservative manage.  Work-up demonstrates evidence of significant disc generation with associated facet arthropathy and a rightward right-sided L4-5 synovial cyst with compression of the right L5 nerve root.  Patient also with a grade 2 L5-S1 lytic spondylolisthesis with severe neuroforaminal stenosis and disc space collapse.  Patient presents now for two-level lumbar decompression and fusion in hopes of improving her symptoms.  Past Medical History:  Diagnosis Date   Allergic rhinitis    Allergy    Neuromuscular disorder ([place])    hand and feet -  tx with neurotin   Neuropathy    Type 2 diabetes mellitus ([place])     Past Surgical History:  Procedure Laterality Date   BTL  1995   CESAREAN SECTION     x 2 (1992 and 1995)   DILITATION & CURRETTAGE/HYSTROSCOPY WITH HYDROTHERMAL ABLATION N/A 08/19/2013   Procedure: DILATATION & CURETTAGE/HYSTEROSCOPY WITH HYDROTHERMAL ABLATION;  Surgeon: Shelly Bombard, MD;  Location: [facility];  Service: Gynecology;  Laterality: N/A;   TONSILLECTOMY AND ADENOIDECTOMY     age 50    Family History  Problem Relation Age of Onset   Cancer - Lung Mother    Diabetes Mother    Diabetes Father    Hypertension Father    Cancer - Colon Brother    Stomach cancer Brother    Uterine cancer Paternal 53    Healthy Brother    Healthy Sister        x2   Heart disease Neg Hx    Social History:  reports that she quit smoking about 13 years ago. Her smoking use included cigarettes. She has a 0.13 pack-year smoking history. She has never used smokeless tobacco. She reports current alcohol use. She reports that she does not use drugs.  Allergies:  Allergies  Allergen Reactions   Latex Rash    fever   Penicillins Rash    Trazodone And Nefazodone Other (See Comments)    Vivid dreams/nightmares    Medications Prior to Admission  Medication Sig Dispense Refill   atorvastatin (LIPITOR) 20 MG tablet Take 1 tablet (20 mg total) by mouth daily. For cholesterol. 90 tablet 1   cyclobenzaprine (FLEXERIL) 10 MG tablet Take 10 mg by mouth 3 (three) times daily.     fluticasone (FLONASE) 50 MCG/ACT nasal spray SHAKE LIQUID AND USE 2 SPRAYS IN EACH NOSTRIL DAILY 16 g 2   gabapentin (NEURONTIN) 300 MG capsule TAKE 1 CAPSULE(300 MG) BY MOUTH TWICE DAILY AS NEEDED FOR BACK PAIN 180 capsule 1   loratadine (CLARITIN) 10 MG tablet Take 10 mg by mouth daily.     metFORMIN (GLUCOPHAGE XR) 500 MG 24 hr tablet Take 1 tablet (500 mg total) by mouth daily with breakfast. For diabetes. 90 tablet 1   mirtazapine (REMERON) 15 MG tablet TAKE 1 TABLET(15 MG) BY MOUTH AT BEDTIME FOR SLEEP 90 tablet 1   Multiple Vitamin (MULTIVITAMIN WITH MINERALS) TABS tablet Take 1 tablet by mouth daily.     PARoxetine (PAXIL) 20 MG tablet TAKE 1 TABLET(20 MG) BY MOUTH DAILY FOR HOT FLASHES 90 tablet 0   potassium chloride SA (KLOR-CON M) 20 MEQ tablet Take 1 tablet (20 mEq total) by mouth daily for 3 days. For low potassium. 3 tablet 0  Continuous Blood Gluc Receiver (FREESTYLE LIBRE 2 READER) DEVI Use to check blood sugars. 1 each 0   Continuous Blood Gluc Sensor (FREESTYLE LIBRE 2 SENSOR) MISC Apply every 14 days to check blood sugars. 3 each 1   fexofenadine (ALLEGRA) 60 MG tablet TAKE 1 TABLET(60 MG) BY MOUTH TWICE DAILY (Patient not taking: Reported on 12/04/2021) 180 tablet 0   methocarbamol (ROBAXIN) 500 MG tablet TAKE 1 tablet BY MOUTH TWICE DAILY AS NEEDED FOR MUSCLE SPASMS (Patient not taking: Reported on 12/04/2021) 180 tablet 0    Results for orders placed or performed during the hospital encounter of 12/09/21 (from the past 48 hour(s))  Glucose, capillary     Status: Abnormal   Collection Time: 12/09/21  8:44 AM  Result Value Ref Range    Glucose-Capillary 122 (H) 70 - 99 mg/dL    Comment: Glucose reference range applies only to samples taken after fasting for at least 8 hours.  ABO/Rh     Status: None (Preliminary result)   Collection Time: 12/09/21  9:34 AM  Result Value Ref Range   ABO/RH(D) PENDING   I-STAT, chem 8     Status: Abnormal   Collection Time: 12/09/21  9:40 AM  Result Value Ref Range   Sodium 140 135 - 145 mmol/L   Potassium 4.7 3.5 - 5.1 mmol/L   Chloride 103 98 - 111 mmol/L   BUN 11 6 - 20 mg/dL   Creatinine, Ser 0.50 0.44 - 1.00 mg/dL   Glucose, Bld 112 (H) 70 - 99 mg/dL    Comment: Glucose reference range applies only to samples taken after fasting for at least 8 hours.   Calcium, Ion 1.20 1.15 - 1.40 mmol/L   TCO2 30 22 - 32 mmol/L   Hemoglobin 16.3 (H) 12.0 - 15.0 g/dL   HCT 48.0 (H) 36.0 - 46.0 %   No results found.  Pertinent items noted in HPI and remainder of comprehensive ROS otherwise negative.  Blood pressure (!) 151/73, pulse 97, temperature 98.6 F (37 C), temperature source Oral, resp. rate 18, height 5\' 2"  (1.575 m), weight 73.9 kg, SpO2 100 %.  Patient is awake and alert.  She is oriented and appropriate.  Speech is fluent.  Judgment insight are intact.  Cranial nerve function normal by.  Motor examination with some mild weakness of dorsiflexion on the right side otherwise motor strength intact.  Sensory examination with decrease sensation pinprick light touch in her right L5 dermatome.  Straight raising is positive on the right equivocal on the left.  Gait is antalgic.  Posture is flexed peer examination head ears eyes nose and throat is unremarkable her chest and abdomen are benign.  Extremities are free from injury or deformity. Assessment/Plan Grade 2 L5-S1 lytic spondylolisthesis with severe foraminal stenosis and right L4-5 synovial cyst with radiculopathy and associated facet arthropathy and disc degeneration.  Plan bilateral L4-5 and L5-S1 decompressive laminotomies and  foraminotomies followed by posterior lumbar MRI fusion utilizing interbody cages, local harvested autograft, and augmented with posterior lateral thesis utilizing segmental pedicle screw fixation and local autografting.  Risks and benefits been explained.  Patient wishes to proceed.  Mallie Mussel A Kyliana Standen 12/09/2021, 10:02 AM

## 2021-12-09 NOTE — Transfer of Care (Signed)
Immediate Anesthesia Transfer of Care Note  Patient: Madison Herrera  Procedure(s) Performed: Posterior Lumbar Interbody Fusion Lumbar four-five, Lumbar five-Sacral one (Back)  Patient Location: PACU  Anesthesia Type:General  Level of Consciousness: drowsy  Airway & Oxygen Therapy: Patient Spontanous Breathing  Post-op Assessment: Report given to RN and Patient moving all extremities X 4  Post vital signs: Reviewed and stable  Last Vitals:  Vitals Value Taken Time  BP 143/73   Temp    Pulse 120 12/09/21 1408  Resp 8 12/09/21 1408  SpO2 98 % 12/09/21 1408  Vitals shown include unvalidated device data.  Last Pain:  Vitals:   12/09/21 0926  TempSrc:   PainSc: 8       Patients Stated Pain Goal: 0 (83/25/49 8264)  Complications: No notable events documented.

## 2021-12-09 NOTE — Progress Notes (Signed)
Orthopedic Tech Progress Note Patient Details:  [name] Jan 02, 1971 [id number]  PACU RN stated "she has a brace"   Patient ID: Madison Herrera, female   DOB: 07/01/1971, 51 y.o.   MRN: [id number]  Janit Pagan 12/09/2021, 2:54 PM

## 2021-12-10 ENCOUNTER — Encounter: Payer: Medicaid Other | Admitting: Primary Care

## 2021-12-10 DIAGNOSIS — M48062 Spinal stenosis, lumbar region with neurogenic claudication: Secondary | ICD-10-CM | POA: Diagnosis not present

## 2021-12-10 LAB — GLUCOSE, CAPILLARY: Glucose-Capillary: 173 mg/dL — ABNORMAL HIGH (ref 70–99)

## 2021-12-10 MED ORDER — OXYCODONE HCL 10 MG PO TABS
10.0000 mg | ORAL_TABLET | ORAL | 0 refills | Status: DC | PRN
Start: 2021-12-10 — End: 2022-04-03

## 2021-12-10 MED ORDER — DIAZEPAM 5 MG PO TABS: 5.0000 mg | ORAL_TABLET | Freq: Four times a day (QID) | ORAL | 0 refills | Status: DC | PRN

## 2021-12-10 MED FILL — Thrombin For Soln 20000 Unit: CUTANEOUS | Qty: 1 | Status: AC

## 2021-12-10 NOTE — Discharge Instructions (Signed)

## 2021-12-10 NOTE — Evaluation (Signed)
Physical Therapy Evaluation Patient Details Name: Madison Herrera MRN: [id number] DOB: 17-Aug-1971 Today's Date: 12/10/2021  History of Present Illness  Pt is a 51 y/o female who presents s/p bilateral L4-S1 PLIF on 12/09/2021. PMH includes DM II, and neuropathy.  Clinical Impression  Pt admitted with above diagnosis. At the time of PT eval, pt was able to demonstrate transfers and ambulation with gross min guard assist to min assist. Occasional min assist required mainly due to pain and reported spasms in back. Pt was educated on precautions, brace application/wearing schedule, appropriate activity progression, and car transfer. Pt currently with functional limitations due to the deficits listed below (see PT Problem List). Pt will benefit from skilled PT to increase their independence and safety with mobility to allow discharge to the venue listed below.         Recommendations for follow up therapy are one component of a multi-disciplinary discharge planning process, led by the attending physician.  Recommendations may be updated based on patient status, additional functional criteria and insurance authorization.  Follow Up Recommendations No PT follow up    Assistance Recommended at Discharge Set up Supervision/Assistance  Patient can return home with the following  A little help with walking and/or transfers;Help with stairs or ramp for entrance    Equipment Recommendations Rolling walker (2 wheels);BSC/3in1  Recommendations for Other Services       Functional Status Assessment Patient has had a recent decline in their functional status and demonstrates the ability to make significant improvements in function in a reasonable and predictable amount of time.     Precautions / Restrictions Precautions Precautions: Back;Fall Precaution Booklet Issued: Yes (comment) Precaution Comments: reviewed back precautions with pt, able to verbalize 3/3 precautions at end of session Required Braces or  Orthoses: Spinal Brace Spinal Brace: Lumbar corset;Applied in sitting position Restrictions Weight Bearing Restrictions: No      Mobility  Bed Mobility Overal bed mobility: Needs Assistance Bed Mobility: Rolling, Sidelying to Sit, Sit to Sidelying Rolling: Min assist Sidelying to sit: Min assist     Sit to sidelying: Min assist General bed mobility comments: Assist throughout for proper log roll technique. Pt in increased pain and reports "I can't do it" when attempting to initiate movement.    Transfers Overall transfer level: Needs assistance Equipment used: Rolling walker (2 wheels) Transfers: Sit to/from Stand Sit to Stand: Min guard, From elevated surface           General transfer comment: VC's for hand placement on seated surface for safety. No assist required however hands on guarding provided throughout due to slow speed and mild unsteadiness.    Ambulation/Gait Ambulation/Gait assistance: Min guard Gait Distance (Feet): 250 Feet Assistive device: Rolling walker (2 wheels) Gait Pattern/deviations: Step-through pattern, Decreased stride length, Trunk flexed, Antalgic Gait velocity: Decreased Gait velocity interpretation: <1.31 ft/sec, indicative of household ambulator   General Gait Details: Attempted with SPC per pt request however pt unsteady and reaching out with free hand for external support. Pt also with difficulty advancing LE's with SPC. Opted for RW and pt was able to demonstrate a more natural gait pattern. Hands on guarding throughout.  Stairs Stairs: Yes Stairs assistance: Min guard Stair Management: One rail Right, Step to pattern, Forwards Number of Stairs: 4 General stair comments: VC's throughout for sequencing and general safety.  Wheelchair Mobility    Modified Rankin (Stroke Patients Only)       Balance Overall balance assessment: Mild deficits observed, not formally tested  Pertinent Vitals/Pain Pain Assessment Pain Assessment: Faces Faces Pain Scale: Hurts whole lot Pain Location: low back Pain Descriptors / Indicators: Discomfort, Constant, Grimacing, Spasm, Operative site guarding Pain Intervention(s): Limited activity within patient's tolerance, Monitored during session, Repositioned    Home Living Family/patient expects to be discharged to:: Private residence Living Arrangements: Spouse/significant other Available Help at Discharge: Family Type of Home: House Home Access: Stairs to enter   Technical brewer of Steps: 4   Home Layout: Two level;Able to live on main level with bedroom/bathroom;Bed/bath upstairs Home Equipment: BSC/3in1;Other (comment);Adaptive equipment (adjustable bed, chair lift) Additional Comments: pt reports having chair lift at home to assist with ascending stairs    Prior Function Prior Level of Function : Independent/Modified Independent             Mobility Comments: does not use AD for mobility, expressed interest in using cane       Hand Dominance        Extremity/Trunk Assessment   Upper Extremity Assessment Upper Extremity Assessment: Defer to OT evaluation    Lower Extremity Assessment Lower Extremity Assessment: Generalized weakness (Consistent with pre-op diagnosis)    Cervical / Trunk Assessment Cervical / Trunk Assessment: Back Surgery  Communication   Communication: No difficulties  Cognition Arousal/Alertness: Awake/alert Behavior During Therapy: WFL for tasks assessed/performed Overall Cognitive Status: Within Functional Limits for tasks assessed                                          General Comments General comments (skin integrity, edema, etc.): spouse present throghout session, also educated on pt's precautions and compensatory strategies for bathing, dressing, and grooming tasks.    Exercises     Assessment/Plan    PT Assessment Patient needs  continued PT services  PT Problem List Decreased strength;Decreased activity tolerance;Decreased balance;Decreased mobility;Decreased knowledge of use of DME;Decreased safety awareness;Decreased knowledge of precautions;Pain       PT Treatment Interventions DME instruction;Gait training;Functional mobility training;Balance training;Stair training;Therapeutic activities;Neuromuscular re-education;Cognitive remediation;Patient/family education    PT Goals (Current goals can be found in the Care Plan section)  Acute Rehab PT Goals Patient Stated Goal: Decrease pain PT Goal Formulation: With patient Time For Goal Achievement: 12/17/21 Potential to Achieve Goals: Good    Frequency Min 5X/week     Co-evaluation               AM-PAC PT "6 Clicks" Mobility  Outcome Measure Help needed turning from your back to your side while in a flat bed without using bedrails?: A Little Help needed moving from lying on your back to sitting on the side of a flat bed without using bedrails?: A Little Help needed moving to and from a bed to a chair (including a wheelchair)?: A Little Help needed standing up from a chair using your arms (e.g., wheelchair or bedside chair)?: A Little Help needed to walk in hospital room?: A Little Help needed climbing 3-5 steps with a railing? : A Little 6 Click Score: 18    End of Session Equipment Utilized During Treatment: Gait belt;Back brace Activity Tolerance: Patient limited by pain Patient left: in bed;with call bell/phone within reach Nurse Communication: Mobility status PT Visit Diagnosis: Unsteadiness on feet (R26.81);Pain Pain - part of body:  (back)    Time: 4128-7867 PT Time Calculation (min) (ACUTE ONLY): 23 min   Charges:   PT Evaluation $PT Eval  Low Complexity: 1 Low PT Treatments $Gait Training: 8-22 mins        Rolinda Roan, PT, DPT Acute Rehabilitation Services Pager: [phone] Office: [phone]   Thelma Comp 12/10/2021, 12:10 PM

## 2021-12-10 NOTE — Evaluation (Signed)
Occupational Therapy Evaluation Patient Details Name: Madison Herrera MRN: [id number] DOB: 1971/03/23 Today's Date: 12/10/2021   History of Present Illness Pt is a 51 y/o F s/p bilateral L4-5 decompressive foraminectomy/laminectomy. PMH includes allergic rhinitis, DM2, and neuropathy.   Clinical Impression   Pt reports independence at baseline with ADLs and functional mobility, lives with spouse who can provide frequent assistance. Pt currently min-mod A for ADLs, min guard for transfers and bed mobility using log rolling technique. Pt able to complete LB dressing with mod A, benefits from use of reacher. Pt educated on back precautions, brace application and wear schedule, and compensatory strategies for dressing, bathing, and grooming tasks, pt verbalized understanding and handout provided. Pt presenting with impairments listed below, however has no acute OT needs at this time, will s/o. Recommend d/c home with assistance.     Recommendations for follow up therapy are one component of a multi-disciplinary discharge planning process, led by the attending physician.  Recommendations may be updated based on patient status, additional functional criteria and insurance authorization.   Follow Up Recommendations  No OT follow up    Assistance Recommended at Discharge Intermittent Supervision/Assistance  Patient can return home with the following A little help with walking and/or transfers;A little help with bathing/dressing/bathroom;Assistance with cooking/housework;Help with stairs or ramp for entrance    Functional Status Assessment  Patient has had a recent decline in their functional status and demonstrates the ability to make significant improvements in function in a reasonable and predictable amount of time.  Equipment Recommendations  BSC/3in1    Recommendations for Other Services PT consult     Precautions / Restrictions Precautions Precautions: Back Precaution Booklet Issued: Yes  (comment) Precaution Comments: reviewed back precautions with pt, able to verbalize 3/3 precautions at end of session Required Braces or Orthoses: Spinal Brace Spinal Brace: Lumbar corset;Applied in sitting position Restrictions Weight Bearing Restrictions: No      Mobility Bed Mobility Overal bed mobility: Needs Assistance Bed Mobility: Sidelying to Sit   Sidelying to sit: HOB elevated, Min guard       General bed mobility comments: pt demonstrates good use of log rolling technique    Transfers Overall transfer level: Needs assistance Equipment used: Rolling walker (2 wheels) Transfers: Sit to/from Stand Sit to Stand: Min guard, From elevated surface           General transfer comment: benefits from RW for initial stand to steady self, ambulates in room without RW      Balance Overall balance assessment: Mild deficits observed, not formally tested                                         ADL either performed or assessed with clinical judgement   ADL Overall ADL's : Needs assistance/impaired Eating/Feeding: Set up;Sitting   Grooming: Oral care;Wash/dry hands;Standing;Min guard   Upper Body Bathing: Minimal assistance;Sitting   Lower Body Bathing: Moderate assistance;Sitting/lateral leans   Upper Body Dressing : Minimal assistance;Sitting;Cueing for compensatory techniques;Cueing for sequencing;Cueing for safety Upper Body Dressing Details (indicate cue type and reason): cues for donning bra while adhering to precautions, cues to don brace Lower Body Dressing: Moderate assistance;Cueing for compensatory techniques;Adhering to back precautions;Sit to/from stand;With adaptive equipment Lower Body Dressing Details (indicate cue type and reason): dons pants and undergarments in sit/stand position Toilet Transfer: Nature conservation officer;Ambulation;Cueing for sequencing;Cueing for safety Toilet Transfer Details (indicate cue  type and reason): practiced  with use of countertop/grab bar to simulate pt's home environment, pt reports having sturdy countertop close to commode Toileting- Clothing Manipulation and Hygiene: Supervision/safety;Sit to/from stand       Functional mobility during ADLs: Min guard;Rolling walker (2 wheels) General ADL Comments: intermittently uses RW during ADLs/in-room ambulation     Vision   Vision Assessment?: No apparent visual deficits     Perception     Praxis      Pertinent Vitals/Pain Pain Assessment Pain Assessment: Faces Pain Score: 7  Faces Pain Scale: Hurts even more Pain Location: low back Pain Descriptors / Indicators: Discomfort, Constant, Grimacing, Guarding Pain Intervention(s): Limited activity within patient's tolerance, Monitored during session, Premedicated before session, Repositioned     Hand Dominance     Extremity/Trunk Assessment Upper Extremity Assessment Upper Extremity Assessment: Overall WFL for tasks assessed   Lower Extremity Assessment Lower Extremity Assessment: Defer to PT evaluation   Cervical / Trunk Assessment Cervical / Trunk Assessment: Back Surgery   Communication Communication Communication: No difficulties   Cognition Arousal/Alertness: Awake/alert Behavior During Therapy: WFL for tasks assessed/performed, Impulsive Overall Cognitive Status: Within Functional Limits for tasks assessed                                 General Comments: impulsive at times, needing repetition of instructions for safety     General Comments  spouse present throghout session, also educated on pt's precautions and compensatory strategies for bathing, dressing, and grooming tasks.    Exercises     Shoulder Instructions      Home Living Family/patient expects to be discharged to:: Private residence Living Arrangements: Spouse/significant other Available Help at Discharge: Family Type of Home: House Home Access: Stairs to enter [employer] of  Steps: 4   Home Layout: Two level;Able to live on main level with bedroom/bathroom;Bed/bath upstairs     Bathroom Shower/Tub: Occupational psychologist: Handicapped height     Home Equipment: Civil engineer, contracting;Other (comment) (reacher and adjustable bed)   Additional Comments: pt reports having chair lift at home to assist with ascending stairs      Prior Functioning/Environment Prior Level of Function : Independent/Modified Independent             Mobility Comments: does not use AD for mobility, expressed interest in using cane          OT Problem List: Decreased strength;Decreased activity tolerance;Decreased range of motion;Impaired balance (sitting and/or standing);Decreased knowledge of use of DME or AE;Decreased safety awareness;Pain      OT Treatment/Interventions:      OT Goals(Current goals can be found in the care plan section) Acute Rehab OT Goals Patient Stated Goal: to go home OT Goal Formulation: With patient Time For Goal Achievement: 12/24/21 Potential to Achieve Goals: Good  OT Frequency:      Co-evaluation              AM-PAC OT "6 Clicks" Daily Activity     Outcome Measure Help from another person eating meals?: None Help from another person taking care of personal grooming?: A Little Help from another person toileting, which includes using toliet, bedpan, or urinal?: A Little Help from another person bathing (including washing, rinsing, drying)?: A Lot Help from another person to put on and taking off regular upper body clothing?: A Little Help from another person to put on and taking off regular lower  body clothing?: A Lot 6 Click Score: 17   End of Session Equipment Utilized During Treatment: Gait belt;Rolling walker (2 wheels);Back brace Nurse Communication: Mobility status  Activity Tolerance: Patient tolerated treatment well Patient left: in bed;with family/visitor present;with call bell/phone within reach (sitting EOB to  eat)  OT Visit Diagnosis: Unsteadiness on feet (R26.81);Other abnormalities of gait and mobility (R26.89);Muscle weakness (generalized) (M62.81);Pain Pain - Right/Left:  (low back)                Time: 6063-0160 OT Time Calculation (min): 39 min Charges:  OT General Charges $OT Visit: 1 Visit OT Evaluation $OT Eval Low Complexity: 1 Low OT Treatments $Self Care/Home Management : 23-37 mins  Lynnda Child, OTD, OTR/L Acute Rehab [phone] - [place] 12/10/2021, 10:03 AM

## 2021-12-10 NOTE — Discharge Summary (Signed)
Physician Discharge Summary  Patient ID: Madison Herrera MRN: [id number] DOB/AGE: June 15, 1971 51 y.o.  Admit date: 12/09/2021 Discharge date: 12/10/2021  Admission Diagnoses:  Discharge Diagnoses:  Principal Problem:   Spondylolisthesis at L5-S1 level   Discharged Condition: good  Hospital Course: Patient mated hospital where she underwent uncomplicated two-level lumbar decompression and fusion.  Postoperatively doing well.  Preoperative back and right lower extremity pain much improved.  Still with a little bit of numbness.  Standing ambulating and voiding without difficulty.  Ready for discharge home.  Consults:   Significant Diagnostic Studies:   Treatments:   Discharge Exam: Blood pressure (!) 172/96, pulse 100, temperature 98.6 F (37 C), temperature source Oral, resp. rate 18, height 5\' 2"  (1.575 m), weight 73.9 kg, SpO2 100 %. Awake and alert.  Oriented and appropriate.  Motor examination intact.  Sensory examination some mild decrease sensation in her right L5 dermatome.  Deep tender versus normal active.  Wound clean and dry.  Chest and abdomen benign.  Disposition: Discharge disposition: 01-Home or Self Care        Allergies as of 12/10/2021       Reactions   Latex Rash   fever   Penicillins Rash   Trazodone And Nefazodone Other (See Comments)   Vivid dreams/nightmares        Medication List     STOP taking these medications    cyclobenzaprine 10 MG tablet Commonly known as: FLEXERIL       TAKE these medications    atorvastatin 20 MG tablet Commonly known as: LIPITOR Take 1 tablet (20 mg total) by mouth daily. For cholesterol.   diazepam 5 MG tablet Commonly known as: VALIUM Take 1-2 tablets (5-10 mg total) by mouth every 6 (six) hours as needed for muscle spasms.   fexofenadine 60 MG tablet Commonly known as: ALLEGRA TAKE 1 TABLET(60 MG) BY MOUTH TWICE DAILY   fluticasone 50 MCG/ACT nasal spray Commonly known as: FLONASE SHAKE LIQUID AND  USE 2 SPRAYS IN EACH NOSTRIL DAILY   FreeStyle Libre 2 Reader Devi Use to check blood sugars.   FreeStyle Libre 2 Sensor Misc Apply every 14 days to check blood sugars.   gabapentin 300 MG capsule Commonly known as: NEURONTIN TAKE 1 CAPSULE(300 MG) BY MOUTH TWICE DAILY AS NEEDED FOR BACK PAIN   loratadine 10 MG tablet Commonly known as: CLARITIN Take 10 mg by mouth daily.   metFORMIN 500 MG 24 hr tablet Commonly known as: Glucophage XR Take 1 tablet (500 mg total) by mouth daily with breakfast. For diabetes.   methocarbamol 500 MG tablet Commonly known as: ROBAXIN TAKE 1 tablet BY MOUTH TWICE DAILY AS NEEDED FOR MUSCLE SPASMS   mirtazapine 15 MG tablet Commonly known as: REMERON TAKE 1 TABLET(15 MG) BY MOUTH AT BEDTIME FOR SLEEP   multivitamin with minerals Tabs tablet Take 1 tablet by mouth daily.   Oxycodone HCl 10 MG Tabs Take 1 tablet (10 mg total) by mouth every 3 (three) hours as needed for severe pain ((score 7 to 10)).   PARoxetine 20 MG tablet Commonly known as: PAXIL TAKE 1 TABLET(20 MG) BY MOUTH DAILY FOR HOT FLASHES   potassium chloride SA 20 MEQ tablet Commonly known as: KLOR-CON M Take 1 tablet (20 mEq total) by mouth daily for 3 days. For low potassium.               Durable Medical Equipment  (From admission, onward)  Start     Ordered   12/09/21 1631  DME Walker rolling  Once       Question:  Patient needs a walker to treat with the following condition  Answer:  Spondylolisthesis at L5-S1 level   12/09/21 1630   12/09/21 1631  DME 3 n 1  Once        12/09/21 1630             Signed: Cooper Render Dayden Viverette 12/10/2021, 9:24 AM

## 2021-12-10 NOTE — Telephone Encounter (Signed)
Madison Herrera, please remove [employer]. She uses [facility] only.

## 2021-12-10 NOTE — Plan of Care (Signed)
Pt doing well. Pt given D/C instructions with verbal understanding, Rx's were sent to the pharmacy by MD. Pt's incision is clean and dry with no sign of infection. Pt's IV was removed prior to D/C. Pt D/C'd home via wheelchair per MD order. Pt is stable @ D/C and has no other needs at this time. Holli Humbles, RN

## 2021-12-10 NOTE — Telephone Encounter (Signed)
Updated as requested.

## 2021-12-11 MED FILL — Heparin Sodium (Porcine) Inj 1000 Unit/ML: INTRAMUSCULAR | Qty: 30 | Status: AC

## 2021-12-11 MED FILL — Sodium Chloride IV Soln 0.9%: INTRAVENOUS | Qty: 1000 | Status: AC

## 2022-02-11 ENCOUNTER — Other Ambulatory Visit: Payer: Self-pay | Admitting: Primary Care

## 2022-02-11 DIAGNOSIS — N951 Menopausal and female climacteric states: Secondary | ICD-10-CM

## 2022-02-22 ENCOUNTER — Other Ambulatory Visit: Payer: Self-pay | Admitting: Primary Care

## 2022-02-22 DIAGNOSIS — E785 Hyperlipidemia, unspecified: Secondary | ICD-10-CM

## 2022-02-23 NOTE — Telephone Encounter (Signed)
Would like to see patient for diabetes follow up in April or May. ?Please schedule.  ?

## 2022-02-24 ENCOUNTER — Other Ambulatory Visit: Payer: Self-pay | Admitting: Primary Care

## 2022-02-24 DIAGNOSIS — E785 Hyperlipidemia, unspecified: Secondary | ICD-10-CM

## 2022-02-28 NOTE — Telephone Encounter (Signed)
Left message to return call to our office.  

## 2022-03-06 NOTE — Telephone Encounter (Signed)
Mail box full my chart sent to patient.  ?

## 2022-03-13 NOTE — Telephone Encounter (Signed)
Patient has appointment scheduled for May. No further action needed at this time.  ?

## 2022-03-29 ENCOUNTER — Other Ambulatory Visit: Payer: Self-pay | Admitting: Primary Care

## 2022-03-29 DIAGNOSIS — E785 Hyperlipidemia, unspecified: Secondary | ICD-10-CM

## 2022-04-03 ENCOUNTER — Ambulatory Visit (INDEPENDENT_AMBULATORY_CARE_PROVIDER_SITE_OTHER): Payer: Commercial Managed Care - HMO | Admitting: Primary Care

## 2022-04-03 ENCOUNTER — Other Ambulatory Visit (HOSPITAL_COMMUNITY)
Admission: RE | Admit: 2022-04-03 | Discharge: 2022-04-03 | Disposition: A | Discharge status: Home / Self Care | Payer: Commercial Managed Care - HMO | Source: Ambulatory Visit | Attending: Primary Care | Admitting: Primary Care

## 2022-04-03 VITALS — BP 122/80 | HR 97 | Ht 62.0 in | Wt 164.0 lb

## 2022-04-03 DIAGNOSIS — E1165 Type 2 diabetes mellitus with hyperglycemia: Secondary | ICD-10-CM

## 2022-04-03 DIAGNOSIS — M5440 Lumbago with sciatica, unspecified side: Secondary | ICD-10-CM | POA: Diagnosis not present

## 2022-04-03 DIAGNOSIS — Z0001 Encounter for general adult medical examination with abnormal findings: Secondary | ICD-10-CM | POA: Diagnosis present

## 2022-04-03 DIAGNOSIS — N951 Menopausal and female climacteric states: Secondary | ICD-10-CM

## 2022-04-03 DIAGNOSIS — Z23 Encounter for immunization: Secondary | ICD-10-CM | POA: Diagnosis not present

## 2022-04-03 DIAGNOSIS — G4700 Insomnia, unspecified: Secondary | ICD-10-CM

## 2022-04-03 DIAGNOSIS — E785 Hyperlipidemia, unspecified: Secondary | ICD-10-CM | POA: Diagnosis not present

## 2022-04-03 DIAGNOSIS — J302 Other seasonal allergic rhinitis: Secondary | ICD-10-CM

## 2022-04-03 DIAGNOSIS — Z1211 Encounter for screening for malignant neoplasm of colon: Secondary | ICD-10-CM

## 2022-04-03 DIAGNOSIS — G8929 Other chronic pain: Secondary | ICD-10-CM

## 2022-04-03 DIAGNOSIS — Z1231 Encounter for screening mammogram for malignant neoplasm of breast: Secondary | ICD-10-CM | POA: Diagnosis not present

## 2022-04-03 LAB — COMPREHENSIVE METABOLIC PANEL
ALT: 25 U/L (ref 0–35)
AST: 27 U/L (ref 0–37)
Albumin: 4.3 g/dL (ref 3.5–5.2)
Alkaline Phosphatase: 84 U/L (ref 39–117)
BUN: 11 mg/dL (ref 6–23)
CO2: 30 mEq/L (ref 19–32)
Calcium: 9.7 mg/dL (ref 8.4–10.5)
Chloride: 106 mEq/L (ref 96–112)
Creatinine, Ser: 0.62 mg/dL (ref 0.40–1.20)
GFR: 102.46 mL/min (ref >60.00)
Glucose, Bld: 121 mg/dL — ABNORMAL HIGH (ref 70–99)
Potassium: 3.3 mEq/L — ABNORMAL LOW (ref 3.5–5.1)
Sodium: 141 mEq/L (ref 135–145)
Total Bilirubin: 0.5 mg/dL (ref 0.2–1.2)
Total Protein: 7.2 g/dL (ref 6.0–8.3)

## 2022-04-03 LAB — LIPID PANEL
Cholesterol: 123 mg/dL (ref 0–200)
HDL: 43.6 mg/dL (ref >39.00)
LDL Cholesterol: 64 mg/dL (ref 0–99)
NonHDL: 79.1
Total CHOL/HDL Ratio: 3
Triglycerides: 76 mg/dL (ref 0.0–149.0)
VLDL: 15.2 mg/dL (ref 0.0–40.0)

## 2022-04-03 LAB — MICROALBUMIN / CREATININE URINE RATIO
Creatinine,U: 147.2 mg/dL
Microalb Creat Ratio: 0.9 mg/g (ref 0.0–30.0)
Microalb, Ur: 1.4 mg/dL (ref 0.0–1.9)

## 2022-04-03 LAB — POCT GLYCOSYLATED HEMOGLOBIN (HGB A1C): Hemoglobin A1C: 5.7 % — AB (ref 4.0–5.6)

## 2022-04-03 MED ORDER — CYCLOBENZAPRINE HCL 10 MG PO TABS: 10.0000 mg | ORAL_TABLET | Freq: Every evening | ORAL | 0 refills | Status: DC | PRN

## 2022-04-03 NOTE — Assessment & Plan Note (Signed)
Overall stable, hot flashes are interfering.  Continue mirtazapine 15 mg HS.

## 2022-04-03 NOTE — Progress Notes (Signed)
Subjective:    Patient ID: Madison Herrera, female    DOB: [birth year], 51 y.o.   MRN: [id number]  HPI  Madison Herrera is a very pleasant 51 y.o. female with a history of type 2 diabetes, uterine leiomyoma, insomnia, chronic back pain, insomnia who presents for complete physical and follow up of chronic conditions.  She would also like to discuss hot flashes, lower extremity and foot pain.   1) Hot Flashes: Currently managed on paroxetine 20 mg daily. Today she endorses continued hot flashes which occur multiple times daily during the day and night. She has difficulty sleeping at night. Previously managed on venlafaxine ER without improvement. She has not seen GYN.   2) Chronic Back Pain/Lower Extremity Pain: Chronic to bilateral lower extremities. She underwent lumbar fusion in January 2023. She is active in PT and follows with her surgeon regularly. She is managed on gabapentin 300 mg TID with some improvement.   Also managed on methocarbamol 500 mg PRN for back pain/spasms. Previously cyclobenzaprine 10 mg which did help. She would like a prescription for this.   She is also requesting a handicap placard as she cannot walk far due to her chronic back pain.   Immunizations: -Tetanus: 2020 -Influenza: Completed this season  -Covid-19: 2 vaccines -Shingles: Never completed  Diet: Fair diet.  Exercise: No regular exercise.  Eye exam: Completes annually  Dental exam: Completes semi-annually   Pap Smear: Completed in 2018 Mammogram: Completed years ago Colonoscopy: Never completed      Review of Systems  Constitutional:  Negative for unexpected weight change.  HENT:  Negative for rhinorrhea.   Respiratory:  Negative for cough and shortness of breath.   Cardiovascular:  Negative for chest pain.  Gastrointestinal:  Negative for constipation and diarrhea.  Genitourinary:  Negative for difficulty urinating.       Hot flashes  Musculoskeletal:  Positive for arthralgias and back pain.   Skin:  Negative for rash.  Allergic/Immunologic: Negative for environmental allergies.  Neurological:  Positive for numbness. Negative for dizziness and headaches.  Psychiatric/Behavioral:  Positive for sleep disturbance. The patient is nervous/anxious.         Past Medical History:  Diagnosis Date   Allergic rhinitis    Allergy    Neuromuscular disorder ([place])    hand and feet -  tx with neurotin   Neuropathy    Type 2 diabetes mellitus ([place])     Social History   Socioeconomic History   Marital status: Married    Spouse name: Not on file   Number of children: Not on file   Years of education: Not on file   Highest education level: Not on file  Occupational History   Not on file  Tobacco Use   Smoking status: Former    Packs/day: 0.25    Years: 0.50    Pack years: 0.13    Types: Cigarettes    Quit date: 2010    Years since quitting: 13.3   Smokeless tobacco: Never  Vaping Use   Vaping Use: Former   Quit date: 10/28/2021  Substance and Sexual Activity   Alcohol use: Yes    Comment: occ (1 glass of wine maybe 2-3 times a month)   Drug use: No   Sexual activity: Not Currently    Partners: Male    Birth control/protection: Surgical  Other Topics Concern   Not on file  Social History Narrative   1 son.   Self employed.   Enjoys spending time  at the beach.    Social Determinants of Health   Financial Resource Strain: Not on file  Food Insecurity: Not on file  Transportation Needs: Not on file  Physical Activity: Not on file  Stress: Not on file  Social Connections: Not on file  Intimate Partner Violence: Not on file    Past Surgical History:  Procedure Laterality Date   [place]     x 2 (1992 and 1995)   [place] HYDROTHERMAL ABLATION N/A 08/19/2013   Procedure: DILATATION & CURETTAGE/HYSTEROSCOPY WITH HYDROTHERMAL ABLATION;  Surgeon: Shelly Bombard, MD;  Location: [facility];  Service: Gynecology;   Laterality: N/A;   TONSILLECTOMY AND ADENOIDECTOMY     age 24    Family History  Problem Relation Age of Onset   Cancer - Lung Mother    Diabetes Mother    Diabetes Father    Hypertension Father    Cancer - Colon Brother    Stomach cancer Brother    Uterine cancer Paternal Aunt    Healthy Brother    Healthy Sister        x2   Heart disease Neg Hx     Allergies  Allergen Reactions   Latex Rash    fever   Penicillins Rash   Trazodone And Nefazodone Other (See Comments)    Vivid dreams/nightmares    Current Outpatient Medications on File Prior to Visit  Medication Sig Dispense Refill   atorvastatin (LIPITOR) 20 MG tablet Take 1 tablet (20 mg total) by mouth daily. for cholesterol. Office visit required for further refills. 30 tablet 0   Continuous Blood Gluc Receiver (FREESTYLE LIBRE 2 READER) DEVI Use to check blood sugars. 1 each 0   fexofenadine (ALLEGRA) 60 MG tablet TAKE 1 TABLET(60 MG) BY MOUTH TWICE DAILY 180 tablet 0   fluticasone (FLONASE) 50 MCG/ACT nasal spray SHAKE LIQUID AND USE 2 SPRAYS IN EACH NOSTRIL DAILY 16 g 2   gabapentin (NEURONTIN) 300 MG capsule TAKE 1 CAPSULE(300 MG) BY MOUTH TWICE DAILY AS NEEDED FOR BACK PAIN 180 capsule 1   loratadine (CLARITIN) 10 MG tablet Take 10 mg by mouth daily.     metFORMIN (GLUCOPHAGE XR) 500 MG 24 hr tablet Take 1 tablet (500 mg total) by mouth daily with breakfast. For diabetes. 90 tablet 1   mirtazapine (REMERON) 15 MG tablet TAKE 1 TABLET(15 MG) BY MOUTH AT BEDTIME FOR SLEEP 90 tablet 1   Multiple Vitamin (MULTIVITAMIN WITH MINERALS) TABS tablet Take 1 tablet by mouth daily.     PARoxetine (PAXIL) 20 MG tablet TAKE 1 TABLET(20 MG) BY MOUTH DAILY FOR HOT FLASHES 90 tablet 0   Continuous Blood Gluc Sensor (FREESTYLE LIBRE 2 SENSOR) MISC Apply every 14 days to check blood sugars. (Patient not taking: Reported on 04/03/2022) 3 each 1   No current facility-administered medications on file prior to visit.    BP 122/80    Pulse 97   Ht '5\' 2"'$  (1.575 m)   Wt 164 lb (74.4 kg)   LMP  (LMP Unknown)   SpO2 100%   BMI 30.00 kg/m  Objective:   Physical Exam HENT:     Right Ear: Tympanic membrane and ear canal normal.     Left Ear: Tympanic membrane and ear canal normal.     Nose: Nose normal.  Eyes:     Conjunctiva/sclera: Conjunctivae normal.     Pupils: Pupils are equal, round, and reactive to light.  Neck:  Thyroid: No thyromegaly.  Cardiovascular:     Rate and Rhythm: Normal rate and regular rhythm.     Heart sounds: No murmur heard. Pulmonary:     Effort: Pulmonary effort is normal.     Breath sounds: Normal breath sounds. No rales.  Abdominal:     General: Bowel sounds are normal.     Palpations: Abdomen is soft.     Tenderness: There is no abdominal tenderness.  Genitourinary:    Labia:        Right: No tenderness or lesion.        Left: No tenderness or lesion.      Vagina: No vaginal discharge or erythema.     Cervix: Normal.     Uterus: Normal.      Adnexa: Right adnexa normal and left adnexa normal.  Musculoskeletal:        General: Normal range of motion.     Cervical back: Neck supple.  Lymphadenopathy:     Cervical: No cervical adenopathy.  Skin:    General: Skin is warm and dry.     Findings: No rash.  Neurological:     Mental Status: She is alert and oriented to person, place, and time.     Cranial Nerves: No cranial nerve deficit.     Deep Tendon Reflexes: Reflexes are normal and symmetric.  Psychiatric:        Mood and Affect: Mood normal.          Assessment & Plan:      This visit occurred during the SARS-CoV-2 public health emergency.  Safety protocols were in place, including screening questions prior to the visit, additional usage of staff PPE, and extensive cleaning of exam room while observing appropriate contact time as indicated for disinfecting solutions.

## 2022-04-03 NOTE — Assessment & Plan Note (Signed)
Uncontrolled.  Failed venlafaxine and paroxetine. Referral placed to GYN.

## 2022-04-03 NOTE — Patient Instructions (Signed)
Stop by the lab prior to leaving today. I will notify you of your results once received.   [facility] to [facility] your [facility].   You will be contacted regarding your referral to GI for the colonoscopy and to GYN for hot flashes.  Please let us know if you have not been contacted within two weeks.   Please schedule a follow up visit for 6 months for a diabetes check and last Shingles shot.  It was a pleasure to see you today!  Preventive Care 45-52 Years Old, Female Preventive care refers to lifestyle choices and visits with your health care provider that can promote health and wellness. Preventive care visits are also called wellness exams. What can I expect for my preventive care visit? Counseling Your health care provider may ask you questions about your: Medical history, including: Past medical problems. Family medical history. Pregnancy history. Current health, including: Menstrual cycle. Method of birth control. Emotional well-being. Home life and relationship well-being. Sexual activity and sexual health. Lifestyle, including: Alcohol, nicotine or tobacco, and drug use. Access to firearms. Diet, exercise, and sleep habits. Work and work Statistician. Sunscreen use. Safety issues such as seatbelt and bike helmet use. Physical exam Your health care provider will check your: Height and weight. These may be used to calculate your BMI (body mass index). BMI is a measurement that tells if you are at a healthy weight. Waist circumference. This measures the distance around your waistline. This measurement also tells if you are at a healthy weight and may help predict your risk of certain diseases, such as type 2 diabetes and high blood pressure. Heart rate and blood pressure. Body temperature. Skin for abnormal spots. What immunizations do I need?  Vaccines are usually given at various ages, according to a schedule. Your health care provider will recommend  vaccines for you based on your age, medical history, and lifestyle or other factors, such as travel or where you work. What tests do I need? Screening Your health care provider may recommend screening tests for certain conditions. This may include: Lipid and cholesterol levels. Diabetes screening. This is done by checking your blood sugar (glucose) after you have not eaten for a while (fasting). Pelvic exam and Pap test. Hepatitis B test. Hepatitis C test. HIV (human immunodeficiency virus) test. STI (sexually transmitted infection) testing, if you are at risk. Lung cancer screening. Colorectal cancer screening. Mammogram. Talk with your health care provider about when you should start having regular mammograms. This may depend on whether you have a family history of breast cancer. BRCA-related cancer screening. This may be done if you have a family history of breast, ovarian, tubal, or peritoneal cancers. Bone density scan. This is done to screen for osteoporosis. Talk with your health care provider about your test results, treatment options, and if necessary, the need for more tests. Follow these instructions at home: Eating and drinking  Eat a diet that includes fresh fruits and vegetables, whole grains, lean protein, and low-fat dairy products. Take vitamin and mineral supplements as recommended by your health care provider. Do not drink alcohol if: Your health care provider tells you not to drink. You are pregnant, may be pregnant, or are planning to become pregnant. If you drink alcohol: Limit how much you have to 0-1 drink a day. Know how much alcohol is in your drink. In the [place] one drink equals one 12 oz bottle of beer (355 mL), one 5 oz glass of wine (148 mL), or  one 1 oz glass of hard liquor (44 mL). Lifestyle Brush your teeth every morning and night with fluoride toothpaste. Floss one time each day. Exercise for at least 30 minutes 5 or more days each week. Do not use  any products that contain nicotine or tobacco. These products include cigarettes, chewing tobacco, and vaping devices, such as e-cigarettes. If you need help quitting, ask your health care provider. Do not use drugs. If you are sexually active, practice safe sex. Use a condom or other form of protection to prevent STIs. If you do not wish to become pregnant, use a form of birth control. If you plan to become pregnant, see your health care provider for a prepregnancy visit. Take aspirin only as told by your health care provider. Make sure that you understand how much to take and what form to take. Work with your health care provider to find out whether it is safe and beneficial for you to take aspirin daily. Find healthy ways to manage stress, such as: Meditation, yoga, or listening to music. Journaling. Talking to a trusted person. Spending time with friends and family. Minimize exposure to UV radiation to reduce your risk of skin cancer. Safety Always wear your seat belt while driving or riding in a vehicle. Do not drive: If you have been drinking alcohol. Do not ride with someone who has been drinking. When you are tired or distracted. While texting. If you have been using any mind-altering substances or drugs. Wear a helmet and other protective equipment during sports activities. If you have firearms in your house, make sure you follow all gun safety procedures. Seek help if you have been physically or sexually abused. What's next? Visit your health care provider once a year for an annual wellness visit. Ask your health care provider how often you should have your eyes and teeth checked. Stay up to date on all vaccines. This information is not intended to replace advice given to you by your health care provider. Make sure you discuss any questions you have with your health care provider. Document Revised: 05/01/2021 Document Reviewed: 05/01/2021 Elsevier Patient Education  [place].

## 2022-04-03 NOTE — Addendum Note (Signed)
Addended by: Tyrone Apple on: 04/03/2022 11:40 AM   Modules accepted: Orders

## 2022-04-03 NOTE — Assessment & Plan Note (Signed)
Shingrix and Pneumovax due, first dose of Shingrix provided today. Tetanus UTD. Pap smear due, completed today. Mammogram overdue, orders placed. Colonoscopy overdue, referral placed to GI.  Discussed the importance of a healthy diet and regular exercise in order for weight loss, and to reduce the risk of further co-morbidity.  Exam today as noted, labs pending.

## 2022-04-03 NOTE — Assessment & Plan Note (Signed)
Continue either Allegra or Claritin. She alternates.

## 2022-04-03 NOTE — Addendum Note (Signed)
Addended by: Sherrilee Gilles B on: 04/03/2022 11:29 AM   Modules accepted: Orders

## 2022-04-03 NOTE — Assessment & Plan Note (Signed)
Controlled with A1C of 5.7 today.  Continue metformin XR 500 mg daily. Urine microalbumin due and pending. Managed on statin.  Foot exam next visit. Pneumonia vaccine next visit.  Follow up in 6 months.

## 2022-04-03 NOTE — Assessment & Plan Note (Addendum)
Improved since surgery.  Continue PT. Rx for cyclobenzaprine 10 mg provided to use PRN. Continue gabapentin 300 mg TID.  Agree to complete handicap placard.

## 2022-04-07 ENCOUNTER — Telehealth: Payer: Self-pay

## 2022-04-07 NOTE — Telephone Encounter (Signed)
Left message for pt to call office back regarding referral that was sent to [facility].  

## 2022-04-07 NOTE — Telephone Encounter (Signed)
CALLED PATIENT NO ANSWER LEFT VOICEMAIL FOR A CALL BACK LETTER SENT 

## 2022-04-08 ENCOUNTER — Telehealth: Payer: Self-pay

## 2022-04-08 LAB — CYTOLOGY - PAP
Adequacy: ABSENT
Comment: NEGATIVE
Diagnosis: NEGATIVE
High risk HPV: NEGATIVE

## 2022-04-08 NOTE — Telephone Encounter (Signed)
Returned patients call no answer left voicemail

## 2022-04-11 DIAGNOSIS — E1165 Type 2 diabetes mellitus with hyperglycemia: Secondary | ICD-10-CM

## 2022-04-11 MED ORDER — FREESTYLE LIBRE 3 SENSOR MISC: 1 refills | Status: DC

## 2022-04-15 ENCOUNTER — Telehealth: Payer: Self-pay

## 2022-04-15 NOTE — Telephone Encounter (Signed)
Prior auth started for [employer] 3 Sensor. Damian Leavell Key: [id number] - [place] Case ID: [id number] Waiting for determination.

## 2022-04-18 ENCOUNTER — Other Ambulatory Visit: Payer: Self-pay | Admitting: Primary Care

## 2022-04-18 DIAGNOSIS — E876 Hypokalemia: Secondary | ICD-10-CM

## 2022-04-18 MED ORDER — POTASSIUM CHLORIDE CRYS ER 20 MEQ PO TBCR: 20.0000 meq | EXTENDED_RELEASE_TABLET | Freq: Every day | ORAL | 0 refills | Status: DC

## 2022-04-22 ENCOUNTER — Other Ambulatory Visit: Payer: Self-pay | Admitting: Primary Care

## 2022-04-22 DIAGNOSIS — E876 Hypokalemia: Secondary | ICD-10-CM

## 2022-04-24 NOTE — Telephone Encounter (Signed)
Prior auth denied for [employer]. Damian Leavell Key: [id number] - [place] Case ID: [id number]  I have reviewed the request to cover Freestyle libre 3 sensor EACH. The information submitted did  not meet the criteria necessary to approve this medication. Based on the information provided, I am unable to approve coverage for this medication because: There is no indication your patient is on any of the following insulin regimens: 1. multiple daily  injections; 2. long-acting basal insulin (for example, glargine, detemir, degludec, NPH); or 3.  continuous subcutaneous external insulin pump. Freestyle Libre 2 sensors and readers and [employer] 3 sensors are considered medically  necessary for the management of type 1 or type 2 diabetes mellitus when the patient is age 82  years or older and on any of the following insulin regimens: 1. multiple daily injections; 2. longacting basal insulin (for example, glargine, detemir, degludec, NPH); or 3. continuous  subcutaneous external insulin pump.  Sent denial letter to scanning.

## 2022-04-25 NOTE — Telephone Encounter (Signed)
Please notify patient that her insurance will not cover the freestyle libre 3.  She can pay cash price for the freestyle libre 3 heart pharmacy, the cost is $75 a month.

## 2022-04-28 ENCOUNTER — Other Ambulatory Visit: Payer: Commercial Managed Care - HMO

## 2022-04-28 NOTE — Telephone Encounter (Signed)
Mail box is full. Not able to leave message.

## 2022-05-01 NOTE — Telephone Encounter (Signed)
Mail box full not able to leave message.

## 2022-05-02 NOTE — Telephone Encounter (Signed)
Called patient x 3 no call back sending my chart.

## 2022-05-14 ENCOUNTER — Other Ambulatory Visit: Payer: Self-pay | Admitting: Primary Care

## 2022-05-14 DIAGNOSIS — N951 Menopausal and female climacteric states: Secondary | ICD-10-CM

## 2022-05-19 ENCOUNTER — Other Ambulatory Visit: Payer: Commercial Managed Care - HMO

## 2022-06-05 ENCOUNTER — Other Ambulatory Visit: Payer: Self-pay | Admitting: Primary Care

## 2022-06-05 DIAGNOSIS — E1165 Type 2 diabetes mellitus with hyperglycemia: Secondary | ICD-10-CM

## 2022-06-26 ENCOUNTER — Other Ambulatory Visit: Payer: Self-pay | Admitting: Primary Care

## 2022-06-26 DIAGNOSIS — G8929 Other chronic pain: Secondary | ICD-10-CM

## 2022-07-27 ENCOUNTER — Other Ambulatory Visit: Payer: Self-pay | Admitting: Primary Care

## 2022-07-27 DIAGNOSIS — G8929 Other chronic pain: Secondary | ICD-10-CM

## 2022-07-27 DIAGNOSIS — E1165 Type 2 diabetes mellitus with hyperglycemia: Secondary | ICD-10-CM

## 2022-08-02 ENCOUNTER — Other Ambulatory Visit: Payer: Self-pay | Admitting: Primary Care

## 2022-08-02 DIAGNOSIS — E876 Hypokalemia: Secondary | ICD-10-CM

## 2022-08-03 NOTE — Telephone Encounter (Signed)
Patient was instructed in May 2023 to return for lab only appt one week later for repeat potassium check. She never did. She will need to come in ASAP for lab check for potassium. I cannot refill until she has this done.

## 2022-08-07 NOTE — Telephone Encounter (Signed)
Left message to return call to our office.  

## 2022-08-08 NOTE — Telephone Encounter (Signed)
Left message to return call to our office. My chart sent to call office.

## 2022-08-11 ENCOUNTER — Encounter: Payer: Self-pay | Admitting: Primary Care

## 2022-08-11 ENCOUNTER — Other Ambulatory Visit: Payer: Self-pay | Admitting: Primary Care

## 2022-08-11 DIAGNOSIS — G8929 Other chronic pain: Secondary | ICD-10-CM

## 2022-08-11 NOTE — Telephone Encounter (Signed)
Call unable to go through on mobile number listed, and unable to reach patient from home number.  Letter mailed out telling patient to call our office.

## 2022-08-18 ENCOUNTER — Other Ambulatory Visit: Payer: Self-pay | Admitting: Primary Care

## 2022-08-18 DIAGNOSIS — N951 Menopausal and female climacteric states: Secondary | ICD-10-CM

## 2022-09-04 ENCOUNTER — Encounter: Payer: Self-pay | Admitting: Primary Care

## 2022-09-04 ENCOUNTER — Ambulatory Visit (INDEPENDENT_AMBULATORY_CARE_PROVIDER_SITE_OTHER): Payer: Commercial Managed Care - HMO | Admitting: Primary Care

## 2022-09-04 VITALS — BP 134/76 | HR 85 | Temp 97.7°F | Ht 62.0 in | Wt 167.0 lb

## 2022-09-04 DIAGNOSIS — E876 Hypokalemia: Secondary | ICD-10-CM

## 2022-09-04 DIAGNOSIS — Z1211 Encounter for screening for malignant neoplasm of colon: Secondary | ICD-10-CM

## 2022-09-04 DIAGNOSIS — E119 Type 2 diabetes mellitus without complications: Secondary | ICD-10-CM | POA: Diagnosis not present

## 2022-09-04 DIAGNOSIS — E1165 Type 2 diabetes mellitus with hyperglycemia: Secondary | ICD-10-CM

## 2022-09-04 LAB — POCT GLYCOSYLATED HEMOGLOBIN (HGB A1C): Hemoglobin A1C: 5.8 % — AB (ref 4.0–5.6)

## 2022-09-04 LAB — POTASSIUM: Potassium: 4.1 mEq/L (ref 3.5–5.1)

## 2022-09-04 NOTE — Patient Instructions (Addendum)
Stop by the lab prior to leaving today. I will notify you of your results once received.   You will be contacted regarding your referral to GI for the colonoscopy.  Please let us know if you have not been contacted within two weeks.   Please schedule a physical to meet with me in mid May 2024  It was a pleasure to see you today!

## 2022-09-04 NOTE — Progress Notes (Signed)
Subjective:    Patient ID: Madison Herrera, female    DOB: [birth year], 51 y.o.   MRN: [id number]  HPI  Lilyonna Steidle is a very pleasant 51 y.o. female with a history of type 2 diabetes, chronic back pain, insomnia,  cervical spine stenosis who presents today for follow up of diabetes. She is also due for colonoscopy.  Current medications include: metformin XR 500 mg daily.  She is checking her blood glucose continuously and is getting readings of low 100's.   Last A1C: 5.7 in May 2023, 5.8 today Last Eye Exam: Due Last Foot Exam: Due Pneumonia Vaccination:  Urine Microalbumin: UTD Statin: atorvastatin   Dietary changes since last visit: Increased water intake. Eating more low carb, limiting fried food.    Exercise: walking   2) Hypokalemia: Chronic. Previously managed on potassium chloride 20 mEq daily. Her last dose of potassium was one month ago as the Rx ran out. She was instructed to return in Summer 2023 for repeat potassium and has yet to do so.    Review of Systems  Respiratory:  Negative for shortness of breath.   Cardiovascular:  Negative for chest pain.  Endocrine: Negative for polydipsia, polyphagia and polyuria.  Neurological:  Positive for numbness.         Past Medical History:  Diagnosis Date   Allergic rhinitis    Allergy    Neuromuscular disorder ([place])    hand and feet -  tx with neurotin   Neuropathy    Type 2 diabetes mellitus ([facility])     Social History   Socioeconomic History   Marital status: Married    Spouse name: Not on file   Number of children: Not on file   Years of education: Not on file   Highest education level: Not on file  Occupational History   Not on file  Tobacco Use   Smoking status: Former    Packs/day: 0.25    Years: 0.50    Total pack years: 0.13    Types: Cigarettes    Quit date: 2010    Years since quitting: 13.8   Smokeless tobacco: Never  Vaping Use   Vaping Use: Former   Quit date: 10/28/2021  Substance and  Sexual Activity   Alcohol use: Yes    Comment: occ (1 glass of wine maybe 2-3 times a month)   Drug use: No   Sexual activity: Not Currently    Partners: Male    Birth control/protection: Surgical  Other Topics Concern   Not on file  Social History Narrative   1 son.   Self employed.   Enjoys spending time at [employer].    Social Determinants of Health   Financial Resource Strain: Not on file  Food Insecurity: Not on file  Transportation Needs: Not on file  Physical Activity: Not on file  Stress: Not on file  Social Connections: Not on file  Intimate Partner Violence: Not on file    Past Surgical History:  Procedure Laterality Date   [place]     x 2 (1992 and 1995)   [place] HYDROTHERMAL ABLATION N/A 08/19/2013   Procedure: DILATATION & CURETTAGE/HYSTEROSCOPY WITH HYDROTHERMAL ABLATION;  Surgeon: Shelly Bombard, MD;  Location: [facility];  Service: Gynecology;  Laterality: N/A;   TONSILLECTOMY AND ADENOIDECTOMY     age 63    Family History  Problem Relation Age of Onset   Cancer - Lung Mother  Diabetes Mother    Diabetes Father    Hypertension Father    Cancer - Colon Brother    Stomach cancer Brother    Uterine cancer Paternal Aunt    Healthy Brother    Healthy Sister        x2   Heart disease Neg Hx     Allergies  Allergen Reactions   Latex Rash    fever   Penicillins Rash   Trazodone And Nefazodone Other (See Comments)    Vivid dreams/nightmares    Current Outpatient Medications on File Prior to Visit  Medication Sig Dispense Refill   atorvastatin (LIPITOR) 20 MG tablet Take 1 tablet (20 mg total) by mouth daily. for cholesterol. 90 tablet 3   Continuous Blood Gluc Sensor (FREESTYLE LIBRE 3 SENSOR) MISC USE AS DIRECTED 3 each 1   cyclobenzaprine (FLEXERIL) 10 MG tablet TAKE 1 TABLET(10 MG) BY MOUTH AT BEDTIME AS NEEDED FOR MUSCLE SPASMS 30 tablet 0   fexofenadine (ALLEGRA) 60 MG tablet TAKE 1  TABLET(60 MG) BY MOUTH TWICE DAILY 180 tablet 0   fluticasone (FLONASE) 50 MCG/ACT nasal spray SHAKE LIQUID AND USE 2 SPRAYS IN EACH NOSTRIL DAILY 16 g 2   gabapentin (NEURONTIN) 300 MG capsule TAKE 1 CAPSULE(300 MG) BY MOUTH TWICE DAILY AS NEEDED FOR BACK PAIN 180 capsule 2   metFORMIN (GLUCOPHAGE-XR) 500 MG 24 hr tablet TAKE 1 TABLET(500 MG) BY MOUTH DAILY WITH BREAKFAST FOR DIABETES 90 tablet 1   mirtazapine (REMERON) 15 MG tablet TAKE 1 TABLET(15 MG) BY MOUTH AT BEDTIME FOR SLEEP 90 tablet 1   Multiple Vitamin (MULTIVITAMIN WITH MINERALS) TABS tablet Take 1 tablet by mouth daily.     PARoxetine (PAXIL) 20 MG tablet TAKE 1 TABLET(20 MG) BY MOUTH DAILY FOR HOT FLASHES 90 tablet 1   Continuous Blood Gluc Receiver (FREESTYLE LIBRE 2 READER) DEVI Use to check blood sugars. (Patient not taking: Reported on 09/04/2022) 1 each 0   loratadine (CLARITIN) 10 MG tablet Take 10 mg by mouth daily. (Patient not taking: Reported on 09/04/2022)     potassium chloride SA (KLOR-CON M) 20 MEQ tablet Take 1 tablet (20 mEq total) by mouth daily. For low potassium. (Patient not taking: Reported on 09/04/2022) 90 tablet 0   No current facility-administered medications on file prior to visit.    BP 134/76   Pulse 85   Temp 97.7 F (36.5 C) (Temporal)   Ht '5\' 2"'$  (1.575 m)   Wt 167 lb (75.8 kg)   SpO2 99%   BMI 30.54 kg/m  Objective:   Physical Exam Cardiovascular:     Rate and Rhythm: Normal rate and regular rhythm.  Pulmonary:     Effort: Pulmonary effort is normal.     Breath sounds: Normal breath sounds.  Musculoskeletal:     Cervical back: Neck supple.  Skin:    General: Skin is warm and dry.           Assessment & Plan:   Problem List Items Addressed This Visit       Endocrine   Type 2 diabetes mellitus ([place]) - Primary    Controlled with A1C of 5.8 today.  Foot exam today. Managed on statin. Urine microalbumin UTD. Pneumonia vaccine UTD.  Follow up in May 2024      Relevant  Orders   POCT glycosylated hemoglobin (Hb A1C) (Completed)     Other   Hypokalemia    Repeat potassium level pending.  Consider re-initiation of potassium 20 mEq  daily. Await results      Relevant Orders   Potassium   Other Visit Diagnoses     Screening for colon cancer       Relevant Orders   Ambulatory referral to Gastroenterology          [name], NP

## 2022-09-04 NOTE — Assessment & Plan Note (Signed)
Repeat potassium level pending.  Consider re-initiation of potassium 20 mEq daily. Await results

## 2022-09-04 NOTE — Assessment & Plan Note (Signed)
Controlled with A1C of 5.8 today.  Foot exam today. Managed on statin. Urine microalbumin UTD. Pneumonia vaccine UTD.  Follow up in May 2024

## 2022-09-10 ENCOUNTER — Encounter: Payer: Self-pay | Admitting: Primary Care

## 2022-10-07 ENCOUNTER — Ambulatory Visit: Payer: Commercial Managed Care - HMO | Admitting: Primary Care

## 2022-11-17 HISTORY — PX: BACK SURGERY: SHX140

## 2022-11-18 ENCOUNTER — Other Ambulatory Visit: Payer: Self-pay | Admitting: Primary Care

## 2022-11-18 DIAGNOSIS — E1165 Type 2 diabetes mellitus with hyperglycemia: Secondary | ICD-10-CM

## 2022-12-31 ENCOUNTER — Other Ambulatory Visit (HOSPITAL_COMMUNITY): Payer: Self-pay

## 2023-01-01 ENCOUNTER — Telehealth: Payer: Self-pay

## 2023-01-01 NOTE — Telephone Encounter (Signed)
Pharmacy Patient Advocate Encounter  Received notification from [facility] that the request for prior authorization for FreestyleLibre 3 has been denied due to .    Please be advised we currently do not have a Pharmacist to review denials, therefore you will need to process appeals accordingly as needed. Thanks for your support at this time.   You may call [phone]  to appeal.

## 2023-01-01 NOTE — Telephone Encounter (Signed)
Pharmacy Patient Advocate Encounter   Received notification from [facility] that prior authorization for [facility] 3 sensor is required/requested.    PA submitted on 01/01/23 to (ins) [employer] [place] via CoverMyMeds Key [id number] Status is pending

## 2023-01-02 NOTE — Telephone Encounter (Signed)
Please notify patient that her freestyle Elenor Legato was denied by the insurance. If she wants one then she can pay cash price at the pharmacy which is $75 per month.

## 2023-01-02 NOTE — Telephone Encounter (Signed)
Unable to reach patient. Unable to leave voicemail.  

## 2023-01-04 ENCOUNTER — Other Ambulatory Visit: Payer: Self-pay | Admitting: Primary Care

## 2023-01-04 DIAGNOSIS — G8929 Other chronic pain: Secondary | ICD-10-CM

## 2023-01-04 DIAGNOSIS — M5441 Lumbago with sciatica, right side: Secondary | ICD-10-CM

## 2023-01-04 DIAGNOSIS — E1165 Type 2 diabetes mellitus with hyperglycemia: Secondary | ICD-10-CM

## 2023-01-04 MED ORDER — FREESTYLE LIBRE 2 READER DEVI: 0 refills | Status: DC

## 2023-01-04 MED ORDER — CYCLOBENZAPRINE HCL 10 MG PO TABS: 10.0000 mg | ORAL_TABLET | Freq: Every day | ORAL | 0 refills | Status: DC | PRN

## 2023-01-04 MED ORDER — ADULT MULTIVITAMIN W/MINERALS CH: 1.0000 | ORAL_TABLET | Freq: Every day | ORAL | 0 refills | Status: DC

## 2023-01-04 NOTE — Telephone Encounter (Signed)
From: Damian Leavell To: Office of [name], NP Sent: 01/03/2023 2:27 PM EST Subject: Medication Renewal Request  Refills have been requested for the following medications:   Continuous Blood Gluc Receiver (FREESTYLE LIBRE 2 READER) DEVI [Donnel Venuto K Nalleli Largent]   Multiple Vitamin (MULTIVITAMIN WITH MINERALS) TABS tablet   cyclobenzaprine (FLEXERIL) 10 MG tablet [Jilberto Vanderwall K Saba Neuman]  Preferred pharmacy: [facility] [place], [place] Delivery method: Pickup

## 2023-01-05 NOTE — Telephone Encounter (Signed)
Spoke with patients husband he will have patient give [place] a call back.

## 2023-01-05 NOTE — Telephone Encounter (Signed)
Patient aware and going to call her insurance

## 2023-02-14 ENCOUNTER — Other Ambulatory Visit: Payer: Self-pay | Admitting: Primary Care

## 2023-02-14 DIAGNOSIS — N951 Menopausal and female climacteric states: Secondary | ICD-10-CM

## 2023-03-23 ENCOUNTER — Encounter: Payer: Self-pay | Admitting: *Deleted

## 2023-03-24 ENCOUNTER — Telehealth: Payer: Self-pay

## 2023-03-24 DIAGNOSIS — E1165 Type 2 diabetes mellitus with hyperglycemia: Secondary | ICD-10-CM

## 2023-03-24 DIAGNOSIS — G4700 Insomnia, unspecified: Secondary | ICD-10-CM

## 2023-03-24 MED ORDER — FREESTYLE LIBRE 2 READER DEVI: 0 refills | Status: DC

## 2023-03-24 MED ORDER — MIRTAZAPINE 15 MG PO TABS: ORAL_TABLET | ORAL | 0 refills | Status: DC

## 2023-03-24 MED ORDER — METFORMIN HCL ER 500 MG PO TB24: ORAL_TABLET | ORAL | 0 refills | Status: DC

## 2023-03-24 NOTE — Telephone Encounter (Signed)
Received refill request for  

## 2023-03-24 NOTE — Telephone Encounter (Signed)
Refills sent to pharmacy. 

## 2023-04-07 ENCOUNTER — Telehealth: Payer: Self-pay

## 2023-04-07 ENCOUNTER — Ambulatory Visit (INDEPENDENT_AMBULATORY_CARE_PROVIDER_SITE_OTHER): Payer: Medicaid Other | Admitting: Primary Care

## 2023-04-07 ENCOUNTER — Encounter: Payer: Self-pay | Admitting: Primary Care

## 2023-04-07 VITALS — BP 154/80 | HR 64 | Temp 98.2°F | Ht 62.0 in | Wt 170.0 lb

## 2023-04-07 DIAGNOSIS — M5440 Lumbago with sciatica, unspecified side: Secondary | ICD-10-CM

## 2023-04-07 DIAGNOSIS — N951 Menopausal and female climacteric states: Secondary | ICD-10-CM | POA: Diagnosis not present

## 2023-04-07 DIAGNOSIS — Z0000 Encounter for general adult medical examination without abnormal findings: Secondary | ICD-10-CM

## 2023-04-07 DIAGNOSIS — F329 Major depressive disorder, single episode, unspecified: Secondary | ICD-10-CM | POA: Insufficient documentation

## 2023-04-07 DIAGNOSIS — E1165 Type 2 diabetes mellitus with hyperglycemia: Secondary | ICD-10-CM | POA: Diagnosis not present

## 2023-04-07 DIAGNOSIS — G4700 Insomnia, unspecified: Secondary | ICD-10-CM | POA: Diagnosis not present

## 2023-04-07 DIAGNOSIS — F330 Major depressive disorder, recurrent, mild: Secondary | ICD-10-CM

## 2023-04-07 DIAGNOSIS — G8929 Other chronic pain: Secondary | ICD-10-CM

## 2023-04-07 DIAGNOSIS — Z7984 Long term (current) use of oral hypoglycemic drugs: Secondary | ICD-10-CM

## 2023-04-07 DIAGNOSIS — R030 Elevated blood-pressure reading, without diagnosis of hypertension: Secondary | ICD-10-CM | POA: Diagnosis not present

## 2023-04-07 DIAGNOSIS — Z1231 Encounter for screening mammogram for malignant neoplasm of breast: Secondary | ICD-10-CM

## 2023-04-07 DIAGNOSIS — Z1211 Encounter for screening for malignant neoplasm of colon: Secondary | ICD-10-CM

## 2023-04-07 LAB — COMPREHENSIVE METABOLIC PANEL
ALT: 7 U/L (ref 0–35)
AST: 13 U/L (ref 0–37)
Albumin: 4.3 g/dL (ref 3.5–5.2)
Alkaline Phosphatase: 68 U/L (ref 39–117)
BUN: 12 mg/dL (ref 6–23)
CO2: 27 mEq/L (ref 19–32)
Calcium: 9.6 mg/dL (ref 8.4–10.5)
Chloride: 106 mEq/L (ref 96–112)
Creatinine, Ser: 0.67 mg/dL (ref 0.40–1.20)
GFR: 99.85 mL/min (ref >60.00)
Glucose, Bld: 120 mg/dL — ABNORMAL HIGH (ref 70–99)
Potassium: 4.2 mEq/L (ref 3.5–5.1)
Sodium: 140 mEq/L (ref 135–145)
Total Bilirubin: 0.8 mg/dL (ref 0.2–1.2)
Total Protein: 7 g/dL (ref 6.0–8.3)

## 2023-04-07 LAB — LIPID PANEL
Cholesterol: 125 mg/dL (ref 0–200)
HDL: 50.6 mg/dL (ref >39.00)
LDL Cholesterol: 63 mg/dL (ref 0–99)
NonHDL: 74.78
Total CHOL/HDL Ratio: 2
Triglycerides: 61 mg/dL (ref 0.0–149.0)
VLDL: 12.2 mg/dL (ref 0.0–40.0)

## 2023-04-07 LAB — CBC
HCT: 42.2 % (ref 36.0–46.0)
Hemoglobin: 13.7 g/dL (ref 12.0–15.0)
MCHC: 32.5 g/dL (ref 30.0–36.0)
MCV: 98.6 fl (ref 78.0–100.0)
Platelets: 286 10*3/uL (ref 150.0–400.0)
RBC: 4.28 Mil/uL (ref 3.87–5.11)
RDW: 13.3 % (ref 11.5–15.5)
WBC: 7.1 10*3/uL (ref 4.0–10.5)

## 2023-04-07 LAB — HEMOGLOBIN A1C: Hgb A1c MFr Bld: 5.5 % (ref 4.6–6.5)

## 2023-04-07 LAB — MICROALBUMIN / CREATININE URINE RATIO
Creatinine,U: 135.7 mg/dL
Microalb Creat Ratio: 0.8 mg/g (ref 0.0–30.0)
Microalb, Ur: 1.1 mg/dL (ref 0.0–1.9)

## 2023-04-07 LAB — TSH: TSH: 0.67 u[IU]/mL (ref 0.35–5.50)

## 2023-04-07 MED ORDER — CYCLOBENZAPRINE HCL 10 MG PO TABS
10.0000 mg | ORAL_TABLET | Freq: Every day | ORAL | 0 refills | Status: DC | PRN
Start: 2023-04-07 — End: 2023-06-01

## 2023-04-07 NOTE — Assessment & Plan Note (Signed)
Repeat A1C pending.  Discussed the importance of a healthy diet and regular exercise in order for weight loss, and to reduce the risk of further co-morbidity. Continue metformin XR 500 mg daily.  Urine microalbumin ordered and pending.  Follow up in 6 months.

## 2023-04-07 NOTE — Assessment & Plan Note (Signed)
Following with [facility] online.  Continue Cymbalta 30 mg daily.  Will have her discontinue Paxil as this has been ineffective for hot flashes.

## 2023-04-07 NOTE — Assessment & Plan Note (Signed)
Controlled.  Continue Mirtazipine 15 mg HS.

## 2023-04-07 NOTE — Assessment & Plan Note (Signed)
Stable.  Continue gabapentin 300 mg BID. Continue cyclobenzaprine 10 mg PRN.

## 2023-04-07 NOTE — Patient Instructions (Signed)
Stop by the lab prior to leaving today. I will notify you of your results once received.   [facility] to schedule your mammogram.   You will either be contacted via phone regarding your referral to GI for the colonoscopy and to GYN, or you may receive a letter on your MyChart portal from our referral team with instructions for scheduling an appointment. Please let us know if you have not been contacted by anyone within two weeks.  Start monitoring your blood pressure daily, around the same time of day, for the next 2-3 weeks.  Ensure that you have rested for 30 minutes prior to checking your blood pressure.   Record your readings and notify me if you see numbers consistently at or above 140 on top and/or 90 on bottom.  Please schedule a follow up visit for 6 months for a diabetes check.  It was a pleasure to see you today!

## 2023-04-07 NOTE — Assessment & Plan Note (Signed)
Immunizations UTD. Pap smear UTD. Mammogram overdue, orders placed. Colonoscopy overdue, she agrees to complete. Referral placed to GI again.  Discussed the importance of a healthy diet and regular exercise in order for weight loss, and to reduce the risk of further co-morbidity.  Exam stable. Labs pending.  Follow up in 1 year for repeat physical.

## 2023-04-07 NOTE — Assessment & Plan Note (Signed)
Uncontrolled.  She never connected with GYN last year. New referral placed today.  Stop Paxil as she is now on Cymbalta through [facility], also as Paxil has been ineffective for hot flashes.

## 2023-04-07 NOTE — Telephone Encounter (Signed)
Returned patients call to schedule her colonoscopy.  Unable to lvm for her due to her voicemail is full.  Thanks, [place], [place]

## 2023-04-07 NOTE — Assessment & Plan Note (Signed)
Above goal today, improved slightly on recheck.  She will start monitoring BP at home and follow up in our office for readings consistently above 140/90.

## 2023-04-07 NOTE — Progress Notes (Signed)
Subjective:    Patient ID: Madison Herrera, female    DOB: 23-Feb-1971, 52 y.o.   MRN: [id number]  HPI  Madison Herrera is a very pleasant 52 y.o. female who presents today for complete physical and follow up of chronic conditions.  Immunizations: -Tetanus: Completed in 2020 -Shingles: Completed Shingrix series   Diet: Fair diet.  Exercise: No regular exercise.  Eye exam: Completes annually  Dental exam: Completes semi-annually    Pap Smear: 2023 Mammogram: Years ago, did not complete last year as recommended.   Colonoscopy: Never completed. Referred to GI in 2023 and she never answered or returned calls.    BP Readings from Last 3 Encounters:  04/07/23 (!) 154/80  09/04/22 134/76  04/03/22 122/80      Review of Systems  Constitutional:  Negative for unexpected weight change.  HENT:  Negative for rhinorrhea.   Respiratory:  Negative for cough and shortness of breath.   Cardiovascular:  Negative for chest pain.  Gastrointestinal:  Negative for constipation and diarrhea.  Genitourinary:  Negative for difficulty urinating.       Hot flashes  Musculoskeletal:  Positive for arthralgias and back pain.  Skin:  Negative for rash.  Allergic/Immunologic: Negative for environmental allergies.  Neurological:  Negative for dizziness and headaches.  Psychiatric/Behavioral:  The patient is not nervous/anxious.        On treatment for depression         Past Medical History:  Diagnosis Date   Allergic rhinitis    Allergy    Dysmenorrhea 07/01/2013   Excessive or frequent menstruation 07/01/2013   Leiomyoma of uterus, unspecified 07/01/2013   Neuromuscular disorder (HCC)    hand and feet -  tx with neurotin   Neuropathy    Type 2 diabetes mellitus (HCC)     Social History   Socioeconomic History   Marital status: Married    Spouse name: Not on file   Number of children: Not on file   Years of education: Not on file   Highest education level: Not on file   Occupational History   Not on file  Tobacco Use   Smoking status: Former    Packs/day: 0.25    Years: 0.50    Additional pack years: 0.00    Total pack years: 0.13    Types: Cigarettes    Quit date: 2010    Years since quitting: 14.3   Smokeless tobacco: Never  Vaping Use   Vaping Use: Former   Quit date: 10/28/2021  Substance and Sexual Activity   Alcohol use: Yes    Comment: occ (1 glass of wine maybe 2-3 times a month)   Drug use: No   Sexual activity: Not Currently    Partners: Male    Birth control/protection: Surgical  Other Topics Concern   Not on file  Social History Narrative   1 son.   Self employed.   Enjoys spending time at [employer].    Social Determinants of Health   Financial Resource Strain: Not on file  Food Insecurity: Not on file  Transportation Needs: Not on file  Physical Activity: Not on file  Stress: Not on file  Social Connections: Not on file  Intimate Partner Violence: Not on file    Past Surgical History:  Procedure Laterality Date   BACK SURGERY  11/2022   BTL  1995   CESAREAN SECTION     x 2 (1992 and 1995)   DILITATION & CURRETTAGE/HYSTROSCOPY WITH HYDROTHERMAL ABLATION N/A  08/19/2013   Procedure: DILATATION & CURETTAGE/HYSTEROSCOPY WITH HYDROTHERMAL ABLATION;  Surgeon: Brock Bad, MD;  Location: [facility];  Service: Gynecology;  Laterality: N/A;   TONSILLECTOMY AND ADENOIDECTOMY     age 71    Family History  Problem Relation Age of Onset   Cancer - Lung Mother    Diabetes Mother    Diabetes Father    Hypertension Father    Cancer - Colon Brother    Stomach cancer Brother    Uterine cancer Paternal Aunt    Healthy Brother    Healthy Sister        x2   Heart disease Neg Hx     Allergies  Allergen Reactions   Latex Rash    fever   Penicillins Rash   Trazodone And Nefazodone Other (See Comments)    Vivid dreams/nightmares    Current Outpatient Medications on File Prior to Visit  Medication Sig Dispense Refill    atorvastatin (LIPITOR) 20 MG tablet Take 1 tablet (20 mg total) by mouth daily. for cholesterol. 90 tablet 3   Continuous Blood Gluc Sensor (FREESTYLE LIBRE 3 SENSOR) MISC USE AS DIRECTED 3 each 1   Continuous Glucose Receiver (FREESTYLE LIBRE 2 READER) DEVI Use to check blood sugars. 1 each 0   DULoxetine (CYMBALTA) 30 MG capsule Take 30 mg by mouth daily.     fexofenadine (ALLEGRA) 60 MG tablet TAKE 1 TABLET(60 MG) BY MOUTH TWICE DAILY 180 tablet 0   fluticasone (FLONASE) 50 MCG/ACT nasal spray SHAKE LIQUID AND USE 2 SPRAYS IN EACH NOSTRIL DAILY 16 g 2   gabapentin (NEURONTIN) 300 MG capsule TAKE 1 CAPSULE(300 MG) BY MOUTH TWICE DAILY AS NEEDED FOR BACK PAIN 180 capsule 2   metFORMIN (GLUCOPHAGE-XR) 500 MG 24 hr tablet TAKE 1 TABLET(500 MG) BY MOUTH DAILY WITH BREAKFAST FOR DIABETES 90 tablet 0   mirtazapine (REMERON) 15 MG tablet TAKE 1 TABLET(15 MG) BY MOUTH AT BEDTIME FOR SLEEP 90 tablet 0   Multiple Vitamin (MULTIVITAMIN WITH MINERALS) TABS tablet Take 1 tablet by mouth daily. 90 tablet 0   loratadine (CLARITIN) 10 MG tablet Take 10 mg by mouth daily. (Patient not taking: Reported on 09/04/2022)     No current facility-administered medications on file prior to visit.    BP (!) 154/80   Pulse 64   Temp 98.2 F (36.8 C) (Temporal)   Ht 5\' 2"  (1.575 m)   Wt 170 lb (77.1 kg)   SpO2 97%   BMI 31.09 kg/m  Objective:   Physical Exam HENT:     Right Ear: Tympanic membrane and ear canal normal.     Left Ear: Tympanic membrane and ear canal normal.     Nose: Nose normal.  Eyes:     Conjunctiva/sclera: Conjunctivae normal.     Pupils: Pupils are equal, round, and reactive to light.  Neck:     Thyroid: No thyromegaly.  Cardiovascular:     Rate and Rhythm: Normal rate and regular rhythm.     Heart sounds: No murmur heard. Pulmonary:     Effort: Pulmonary effort is normal.     Breath sounds: Normal breath sounds. No rales.  Abdominal:     General: Bowel sounds are normal.      Palpations: Abdomen is soft.     Tenderness: There is no abdominal tenderness.  Musculoskeletal:        General: Normal range of motion.     Cervical back: Neck supple.  Lymphadenopathy:  Cervical: No cervical adenopathy.  Skin:    General: Skin is warm and dry.     Findings: No rash.  Neurological:     Mental Status: She is alert and oriented to person, place, and time.     Cranial Nerves: No cranial nerve deficit.     Deep Tendon Reflexes: Reflexes are normal and symmetric.  Psychiatric:        Mood and Affect: Mood normal.           Assessment & Plan:  Preventative health care Assessment & Plan: Immunizations UTD. Pap smear UTD. Mammogram overdue, orders placed. Colonoscopy overdue, she agrees to complete. Referral placed to GI again.  Discussed the importance of a healthy diet and regular exercise in order for weight loss, and to reduce the risk of further co-morbidity.  Exam stable. Labs pending.  Follow up in 1 year for repeat physical.    Mild episode of recurrent major depressive disorder [facility]) Assessment & Plan: Following with [facility] online.  Continue Cymbalta 30 mg daily.  Will have her discontinue Paxil as this has been ineffective for hot flashes.    Insomnia, unspecified type Assessment & Plan: Controlled.  Continue Mirtazipine 15 mg HS.    Perimenopausal vasomotor symptoms Assessment & Plan: Uncontrolled.  She never connected with GYN last year. New referral placed today.  Stop Paxil as she is now on Cymbalta through [facility], also as Paxil has been ineffective for hot flashes.  Orders: -     Ambulatory referral to Obstetrics / Gynecology -     TSH -     CBC  Type 2 diabetes mellitus with hyperglycemia, without long-term current use of insulin (HCC) Assessment & Plan: Repeat A1C pending.  Discussed the importance of a healthy diet and regular exercise in order for weight loss, and to reduce the risk of further  co-morbidity. Continue metformin XR 500 mg daily.  Urine microalbumin ordered and pending.  Follow up in 6 months.  Orders: -     Microalbumin / creatinine urine ratio -     Lipid panel -     Hemoglobin A1c -     Comprehensive metabolic panel -     TSH -     CBC  Chronic bilateral low back pain with sciatica, sciatica laterality unspecified Assessment & Plan: Stable.  Continue gabapentin 300 mg BID. Continue cyclobenzaprine 10 mg PRN.   Orders: -     Cyclobenzaprine HCl; Take 1 tablet (10 mg total) by mouth daily as needed for muscle spasms.  Dispense: 30 tablet; Refill: 0  Screening for colon cancer -     Ambulatory referral to Gastroenterology  Screening mammogram for breast cancer -     3D Screening Mammogram, Left and Right; Future  Elevated blood pressure reading without diagnosis of hypertension Assessment & Plan: Above goal today, improved slightly on recheck.  She will start monitoring BP at home and follow up in our office for readings consistently above 140/90.          Doreene Nest, NP

## 2023-04-16 ENCOUNTER — Encounter: Payer: Self-pay | Admitting: *Deleted

## 2023-05-31 ENCOUNTER — Other Ambulatory Visit: Payer: Self-pay | Admitting: Primary Care

## 2023-05-31 DIAGNOSIS — E1165 Type 2 diabetes mellitus with hyperglycemia: Secondary | ICD-10-CM

## 2023-06-01 ENCOUNTER — Other Ambulatory Visit: Payer: Self-pay | Admitting: Primary Care

## 2023-06-01 DIAGNOSIS — E785 Hyperlipidemia, unspecified: Secondary | ICD-10-CM

## 2023-06-01 DIAGNOSIS — G8929 Other chronic pain: Secondary | ICD-10-CM

## 2023-06-10 ENCOUNTER — Telehealth: Payer: Self-pay

## 2023-06-10 DIAGNOSIS — E1165 Type 2 diabetes mellitus with hyperglycemia: Secondary | ICD-10-CM

## 2023-06-10 MED ORDER — FREESTYLE LIBRE 3 SENSOR MISC
1 refills | Status: DC
Start: 2023-06-10 — End: 2023-10-08

## 2023-06-10 NOTE — Telephone Encounter (Signed)
Refills sent to pharmacy. 

## 2023-07-06 ENCOUNTER — Other Ambulatory Visit: Payer: Self-pay | Admitting: Primary Care

## 2023-07-06 DIAGNOSIS — G4700 Insomnia, unspecified: Secondary | ICD-10-CM

## 2023-07-11 ENCOUNTER — Other Ambulatory Visit: Payer: Self-pay | Admitting: Primary Care

## 2023-07-11 DIAGNOSIS — G8929 Other chronic pain: Secondary | ICD-10-CM

## 2023-07-27 MED ORDER — GABAPENTIN 300 MG PO CAPS
300.0000 mg | ORAL_CAPSULE | Freq: Two times a day (BID) | ORAL | 1 refills | Status: DC
Start: 2023-07-27 — End: 2023-10-08

## 2023-07-27 MED ORDER — CYCLOBENZAPRINE HCL 10 MG PO TABS
10.0000 mg | ORAL_TABLET | ORAL | 0 refills | Status: DC | PRN
Start: 2023-07-27 — End: 2023-09-14

## 2023-08-10 ENCOUNTER — Encounter: Payer: Self-pay | Admitting: *Deleted

## 2023-09-01 ENCOUNTER — Encounter: Payer: Self-pay | Admitting: Primary Care

## 2023-09-14 ENCOUNTER — Other Ambulatory Visit: Payer: Self-pay | Admitting: Primary Care

## 2023-09-14 DIAGNOSIS — G8929 Other chronic pain: Secondary | ICD-10-CM

## 2023-09-14 DIAGNOSIS — E1165 Type 2 diabetes mellitus with hyperglycemia: Secondary | ICD-10-CM

## 2023-09-21 ENCOUNTER — Other Ambulatory Visit: Payer: Self-pay

## 2023-09-21 DIAGNOSIS — E1165 Type 2 diabetes mellitus with hyperglycemia: Secondary | ICD-10-CM

## 2023-09-21 MED ORDER — METFORMIN HCL ER 500 MG PO TB24
ORAL_TABLET | ORAL | 0 refills | Status: DC
Start: 2023-09-21 — End: 2023-10-08

## 2023-09-24 ENCOUNTER — Telehealth: Payer: Self-pay | Admitting: Primary Care

## 2023-09-24 NOTE — Telephone Encounter (Signed)
Please contact [facility] in [place]  I am attempting to complete the certificate online but it looks like they may have completed it incorrectly or done something to prevent me from editing.  Specifically, the "decedent" section is the problem.

## 2023-09-24 NOTE — Telephone Encounter (Signed)
Received an email from [place] at the [facility] department stating that the death was not caused by trauma.  We need to request the EMS report or speak with [facility] to find out what is going on.  Okay to wait for April to contact us.  Will need the cause of death and for them to make adjustments in [facility] for the death certificate.

## 2023-09-24 NOTE — Telephone Encounter (Signed)
Spoke with Dois Davenport of [facility] relaying Kate's message. States pt's remains were transferred around lunchtime today to [facility], phn # [phone].  Called [employer] informing them I was calling from the providers office in reference to the death certificate. Told message would be routed to April to give [place] a call back.

## 2023-09-24 NOTE — Telephone Encounter (Signed)
Received an email from [facility] to sign a death certificate. I've received no information about her passing, when she passed, how she passed, or which funeral home is contacting.  Are they any family members on file? Can we find out what happened?

## 2023-09-24 NOTE — Telephone Encounter (Signed)
Spoke to husband. They will be using [facility] in [place]. Patient passed away on 10-07-23 at around 3 am. They have determined cause of death "trama" per Ladene Artist. Her son told him she had fall in shower that day but was not evaluated.

## 2023-09-28 NOTE — Telephone Encounter (Signed)
Called and spoke with April at [facility]. She stated that death certificate hasn't been signed yet, and it should allow Jae Dire to edit it now. She stated the cause of death cannot be put on the certificate as trauma because per the EMS report there was no evidence of trauma or a fall (as in a fall did not happen around the time of her death). April suggested since we do not know the cause of death that we document death due to natural causes with all of her diagnosis listed. Specifically April mentioned diabetes diagnosis. If you have any questions or concerns contact April directly at [phone].

## 2023-09-28 NOTE — Telephone Encounter (Signed)
Noted, death certificate signed.

## 2023-09-28 NOTE — Telephone Encounter (Signed)
Called and spoke with receptionist at [facility]. April was not at her desk, she sent a message back for her to give our office a call back

## 2023-10-08 ENCOUNTER — Ambulatory Visit: Payer: Medicaid Other | Admitting: Primary Care

## 2023-10-18 DEATH — deceased

## 2023-10-21 ENCOUNTER — Encounter: Payer: Medicaid Other | Admitting: Family Medicine
